# Patient Record
Sex: Female | Born: 1940 | Race: White | Hispanic: No | Smoking: Former smoker
Health system: Southern US, Community
[De-identification: ages and names within clinical notes are randomized; demographics above are authoritative.]

## PROBLEM LIST (undated history)

## (undated) DIAGNOSIS — C801 Malignant (primary) neoplasm, unspecified: Secondary | ICD-10-CM

## (undated) DIAGNOSIS — I10 Essential (primary) hypertension: Secondary | ICD-10-CM

## (undated) DIAGNOSIS — C50919 Malignant neoplasm of unspecified site of unspecified female breast: Secondary | ICD-10-CM

## (undated) DIAGNOSIS — M75122 Complete rotator cuff tear or rupture of left shoulder, not specified as traumatic: Secondary | ICD-10-CM

## (undated) DIAGNOSIS — S62102A Fracture of unspecified carpal bone, left wrist, initial encounter for closed fracture: Secondary | ICD-10-CM

## (undated) DIAGNOSIS — S62101A Fracture of unspecified carpal bone, right wrist, initial encounter for closed fracture: Secondary | ICD-10-CM

## (undated) DIAGNOSIS — Z89619 Acquired absence of unspecified leg above knee: Secondary | ICD-10-CM

## (undated) HISTORY — DX: Fracture of unspecified carpal bone, right wrist, initial encounter for closed fracture: S62.102A

## (undated) HISTORY — DX: Malignant (primary) neoplasm, unspecified: C80.1

## (undated) HISTORY — DX: Complete rotator cuff tear or rupture of left shoulder, not specified as traumatic: M75.122

## (undated) HISTORY — PX: ABDOMINAL HYSTERECTOMY: SHX81

## (undated) HISTORY — DX: Fracture of unspecified carpal bone, right wrist, initial encounter for closed fracture: S62.101A

## (undated) HISTORY — DX: Essential (primary) hypertension: I10

## (undated) HISTORY — PX: TONSILLECTOMY: SUR1361

## (undated) HISTORY — DX: Acquired absence of unspecified leg above knee: Z89.619

## (undated) HISTORY — DX: Malignant neoplasm of unspecified site of unspecified female breast: C50.919

---
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
## 2001-07-11 ENCOUNTER — Encounter (INDEPENDENT_AMBULATORY_CARE_PROVIDER_SITE_OTHER): Payer: Self-pay | Admitting: *Deleted

## 2003-04-17 ENCOUNTER — Encounter: Payer: Self-pay | Admitting: Sports Medicine

## 2003-04-17 ENCOUNTER — Encounter: Admission: RE | Admit: 2003-04-17 | Discharge: 2003-04-17 | Payer: Self-pay | Admitting: Family Medicine

## 2003-04-17 ENCOUNTER — Encounter: Admission: RE | Admit: 2003-04-17 | Discharge: 2003-04-17 | Payer: Self-pay | Admitting: Sports Medicine

## 2003-06-09 ENCOUNTER — Encounter: Admission: RE | Admit: 2003-06-09 | Discharge: 2003-06-09 | Payer: Self-pay | Admitting: Family Medicine

## 2003-10-09 ENCOUNTER — Encounter: Admission: RE | Admit: 2003-10-09 | Discharge: 2003-10-09 | Payer: Self-pay | Admitting: Sports Medicine

## 2003-10-13 ENCOUNTER — Encounter: Admission: RE | Admit: 2003-10-13 | Discharge: 2003-10-13 | Payer: Self-pay | Admitting: Family Medicine

## 2003-10-27 ENCOUNTER — Encounter: Admission: RE | Admit: 2003-10-27 | Discharge: 2003-10-27 | Payer: Self-pay | Admitting: Family Medicine

## 2003-11-11 ENCOUNTER — Ambulatory Visit (HOSPITAL_COMMUNITY): Admission: RE | Admit: 2003-11-11 | Discharge: 2003-11-11 | Payer: Self-pay | Admitting: Sports Medicine

## 2003-12-24 ENCOUNTER — Ambulatory Visit (HOSPITAL_COMMUNITY): Admission: RE | Admit: 2003-12-24 | Discharge: 2003-12-24 | Payer: Self-pay | Admitting: Sports Medicine

## 2003-12-26 ENCOUNTER — Encounter: Admission: RE | Admit: 2003-12-26 | Discharge: 2003-12-26 | Payer: Self-pay | Admitting: Family Medicine

## 2004-01-13 ENCOUNTER — Encounter: Admission: RE | Admit: 2004-01-13 | Discharge: 2004-01-13 | Payer: Self-pay | Admitting: Sports Medicine

## 2004-02-04 ENCOUNTER — Encounter: Admission: RE | Admit: 2004-02-04 | Discharge: 2004-02-04 | Payer: Self-pay | Admitting: Family Medicine

## 2004-02-06 ENCOUNTER — Encounter: Admission: RE | Admit: 2004-02-06 | Discharge: 2004-02-06 | Payer: Self-pay | Admitting: Family Medicine

## 2004-05-31 ENCOUNTER — Ambulatory Visit: Payer: Self-pay | Admitting: Sports Medicine

## 2004-06-09 ENCOUNTER — Ambulatory Visit: Payer: Self-pay | Admitting: Family Medicine

## 2004-08-25 ENCOUNTER — Ambulatory Visit: Payer: Self-pay | Admitting: Family Medicine

## 2004-08-26 ENCOUNTER — Ambulatory Visit: Payer: Self-pay | Admitting: Sports Medicine

## 2004-08-27 ENCOUNTER — Ambulatory Visit: Payer: Self-pay | Admitting: Family Medicine

## 2004-09-10 ENCOUNTER — Ambulatory Visit: Payer: Self-pay | Admitting: Family Medicine

## 2004-10-11 ENCOUNTER — Ambulatory Visit: Payer: Self-pay | Admitting: Family Medicine

## 2004-10-29 ENCOUNTER — Ambulatory Visit: Payer: Self-pay | Admitting: Sports Medicine

## 2004-11-24 ENCOUNTER — Ambulatory Visit: Payer: Self-pay | Admitting: Family Medicine

## 2004-12-28 ENCOUNTER — Ambulatory Visit: Payer: Self-pay | Admitting: Family Medicine

## 2005-03-09 ENCOUNTER — Ambulatory Visit: Payer: Self-pay | Admitting: Family Medicine

## 2005-03-25 ENCOUNTER — Ambulatory Visit: Payer: Self-pay | Admitting: Family Medicine

## 2005-03-30 ENCOUNTER — Encounter: Admission: RE | Admit: 2005-03-30 | Discharge: 2005-03-30 | Payer: Self-pay | Admitting: *Deleted

## 2005-04-12 ENCOUNTER — Encounter: Admission: RE | Admit: 2005-04-12 | Discharge: 2005-04-12 | Payer: Self-pay | Admitting: *Deleted

## 2005-11-01 ENCOUNTER — Other Ambulatory Visit: Admission: RE | Admit: 2005-11-01 | Discharge: 2005-11-01 | Payer: Self-pay | Admitting: Family Medicine

## 2005-11-01 ENCOUNTER — Ambulatory Visit: Payer: Self-pay | Admitting: Family Medicine

## 2005-11-01 ENCOUNTER — Encounter: Payer: Self-pay | Admitting: Family Medicine

## 2005-11-11 ENCOUNTER — Encounter: Admission: RE | Admit: 2005-11-11 | Discharge: 2005-11-11 | Payer: Self-pay | Admitting: Family Medicine

## 2005-11-14 ENCOUNTER — Ambulatory Visit: Payer: Self-pay | Admitting: Family Medicine

## 2005-12-21 ENCOUNTER — Ambulatory Visit: Payer: Self-pay | Admitting: Family Medicine

## 2005-12-30 ENCOUNTER — Encounter: Admission: RE | Admit: 2005-12-30 | Discharge: 2005-12-30 | Payer: Self-pay | Admitting: Family Medicine

## 2006-04-13 ENCOUNTER — Encounter: Admission: RE | Admit: 2006-04-13 | Discharge: 2006-04-13 | Payer: Self-pay | Admitting: Family Medicine

## 2006-08-01 ENCOUNTER — Ambulatory Visit: Payer: Self-pay | Admitting: Family Medicine

## 2006-09-07 DIAGNOSIS — N952 Postmenopausal atrophic vaginitis: Secondary | ICD-10-CM

## 2006-09-07 DIAGNOSIS — B0229 Other postherpetic nervous system involvement: Secondary | ICD-10-CM

## 2006-09-07 DIAGNOSIS — I10 Essential (primary) hypertension: Secondary | ICD-10-CM | POA: Insufficient documentation

## 2006-09-07 DIAGNOSIS — H9190 Unspecified hearing loss, unspecified ear: Secondary | ICD-10-CM | POA: Insufficient documentation

## 2006-09-08 ENCOUNTER — Encounter (INDEPENDENT_AMBULATORY_CARE_PROVIDER_SITE_OTHER): Payer: Self-pay | Admitting: *Deleted

## 2007-03-21 ENCOUNTER — Ambulatory Visit: Payer: Self-pay | Admitting: Family Medicine

## 2007-03-21 DIAGNOSIS — S78119A Complete traumatic amputation at level between unspecified hip and knee, initial encounter: Secondary | ICD-10-CM | POA: Insufficient documentation

## 2007-03-21 DIAGNOSIS — R030 Elevated blood-pressure reading, without diagnosis of hypertension: Secondary | ICD-10-CM | POA: Insufficient documentation

## 2007-04-16 ENCOUNTER — Encounter: Payer: Self-pay | Admitting: Family Medicine

## 2007-04-16 ENCOUNTER — Encounter: Admission: RE | Admit: 2007-04-16 | Discharge: 2007-04-16 | Payer: Self-pay | Admitting: Family Medicine

## 2007-04-18 ENCOUNTER — Encounter: Admission: RE | Admit: 2007-04-18 | Discharge: 2007-04-18 | Payer: Self-pay | Admitting: Family Medicine

## 2007-04-19 ENCOUNTER — Encounter: Payer: Self-pay | Admitting: Family Medicine

## 2007-05-30 ENCOUNTER — Emergency Department (HOSPITAL_COMMUNITY): Admission: EM | Admit: 2007-05-30 | Discharge: 2007-05-30 | Payer: Self-pay | Admitting: Family Medicine

## 2007-05-31 ENCOUNTER — Telehealth: Payer: Self-pay | Admitting: Family Medicine

## 2007-06-12 ENCOUNTER — Emergency Department (HOSPITAL_COMMUNITY): Admission: EM | Admit: 2007-06-12 | Discharge: 2007-06-12 | Payer: Self-pay | Admitting: Emergency Medicine

## 2007-06-12 ENCOUNTER — Encounter: Payer: Self-pay | Admitting: Family Medicine

## 2007-06-18 ENCOUNTER — Encounter: Payer: Self-pay | Admitting: Internal Medicine

## 2007-06-26 ENCOUNTER — Ambulatory Visit: Payer: Self-pay | Admitting: Internal Medicine

## 2007-07-08 LAB — CONVERTED CEMR LAB
Basophils Absolute: 0 10*3/uL (ref 0.0–0.1)
HCT: 38.4 % (ref 36.0–46.0)
Hemoglobin: 13.2 g/dL (ref 12.0–15.0)
Lymphocytes Relative: 26.1 % (ref 12.0–46.0)
MCHC: 34.4 g/dL (ref 30.0–36.0)
MCV: 92.8 fL (ref 78.0–100.0)
Monocytes Absolute: 0.4 10*3/uL (ref 0.2–0.7)
Monocytes Relative: 7.1 % (ref 3.0–11.0)
Neutro Abs: 4 10*3/uL (ref 1.4–7.7)
Neutrophils Relative %: 63.9 % (ref 43.0–77.0)
RDW: 12.1 % (ref 11.5–14.6)

## 2007-07-09 ENCOUNTER — Encounter (INDEPENDENT_AMBULATORY_CARE_PROVIDER_SITE_OTHER): Payer: Self-pay | Admitting: *Deleted

## 2007-10-15 ENCOUNTER — Ambulatory Visit: Payer: Self-pay | Admitting: Family Medicine

## 2007-10-15 DIAGNOSIS — R5383 Other fatigue: Secondary | ICD-10-CM

## 2007-10-15 DIAGNOSIS — R0609 Other forms of dyspnea: Secondary | ICD-10-CM

## 2007-10-15 DIAGNOSIS — R5381 Other malaise: Secondary | ICD-10-CM

## 2007-10-15 DIAGNOSIS — R0989 Other specified symptoms and signs involving the circulatory and respiratory systems: Secondary | ICD-10-CM

## 2007-10-16 ENCOUNTER — Telehealth (INDEPENDENT_AMBULATORY_CARE_PROVIDER_SITE_OTHER): Payer: Self-pay | Admitting: *Deleted

## 2007-10-16 ENCOUNTER — Encounter: Payer: Self-pay | Admitting: Family Medicine

## 2007-10-16 ENCOUNTER — Encounter (INDEPENDENT_AMBULATORY_CARE_PROVIDER_SITE_OTHER): Payer: Self-pay | Admitting: *Deleted

## 2007-10-19 LAB — CONVERTED CEMR LAB
Calcium: 9.5 mg/dL (ref 8.4–10.5)
Chloride: 101 meq/L (ref 96–112)
Creatinine, Ser: 0.8 mg/dL (ref 0.4–1.2)
Eosinophils Absolute: 0.1 10*3/uL (ref 0.0–0.7)
Eosinophils Relative: 2.2 % (ref 0.0–5.0)
Folate: 14 ng/mL
Free T4: 0.9 ng/dL (ref 0.6–1.6)
GFR calc non Af Amer: 76 mL/min
HCT: 39.2 % (ref 36.0–46.0)
Monocytes Absolute: 0.4 10*3/uL (ref 0.1–1.0)
Monocytes Relative: 12.7 % — ABNORMAL HIGH (ref 3.0–12.0)
Neutrophils Relative %: 10.7 % — ABNORMAL LOW (ref 43.0–77.0)
Platelets: 238 10*3/uL (ref 150–400)
RDW: 12.2 % (ref 11.5–14.6)
Sodium: 138 meq/L (ref 135–145)
T3, Free: 2.7 pg/mL (ref 2.3–4.2)
Vitamin B-12: 527 pg/mL (ref 211–911)
WBC: 3.2 10*3/uL — ABNORMAL LOW (ref 4.5–10.5)

## 2007-10-22 ENCOUNTER — Telehealth: Payer: Self-pay | Admitting: Family Medicine

## 2007-10-22 ENCOUNTER — Encounter: Payer: Self-pay | Admitting: Family Medicine

## 2007-10-23 ENCOUNTER — Telehealth (INDEPENDENT_AMBULATORY_CARE_PROVIDER_SITE_OTHER): Payer: Self-pay | Admitting: *Deleted

## 2007-10-24 ENCOUNTER — Ambulatory Visit: Payer: Self-pay

## 2007-10-24 ENCOUNTER — Ambulatory Visit: Payer: Self-pay | Admitting: Family Medicine

## 2007-10-24 ENCOUNTER — Encounter: Payer: Self-pay | Admitting: Family Medicine

## 2007-10-24 DIAGNOSIS — D7289 Other specified disorders of white blood cells: Secondary | ICD-10-CM

## 2007-10-24 LAB — CONVERTED CEMR LAB
Basophils Absolute: 0 10*3/uL (ref 0.0–0.1)
Eosinophils Absolute: 0.1 10*3/uL (ref 0.0–0.7)
HCT: 37.3 % (ref 36.0–46.0)
MCV: 94.1 fL (ref 78.0–100.0)
Monocytes Absolute: 0.5 10*3/uL (ref 0.1–1.0)
Neutrophils Relative %: 60.5 % (ref 43.0–77.0)
Platelets: 233 10*3/uL (ref 150–400)
RDW: 12 % (ref 11.5–14.6)

## 2007-10-25 ENCOUNTER — Encounter (INDEPENDENT_AMBULATORY_CARE_PROVIDER_SITE_OTHER): Payer: Self-pay | Admitting: *Deleted

## 2007-10-25 DIAGNOSIS — R9389 Abnormal findings on diagnostic imaging of other specified body structures: Secondary | ICD-10-CM | POA: Insufficient documentation

## 2007-10-26 ENCOUNTER — Ambulatory Visit: Payer: Self-pay | Admitting: Internal Medicine

## 2007-10-26 ENCOUNTER — Encounter (INDEPENDENT_AMBULATORY_CARE_PROVIDER_SITE_OTHER): Payer: Self-pay | Admitting: *Deleted

## 2007-10-26 DIAGNOSIS — J019 Acute sinusitis, unspecified: Secondary | ICD-10-CM

## 2007-10-30 ENCOUNTER — Telehealth (INDEPENDENT_AMBULATORY_CARE_PROVIDER_SITE_OTHER): Payer: Self-pay | Admitting: *Deleted

## 2007-11-12 ENCOUNTER — Ambulatory Visit: Payer: Self-pay | Admitting: Cardiovascular Disease

## 2007-11-12 LAB — CONVERTED CEMR LAB
Free T4: 1 ng/dL (ref 0.6–1.6)
TSH: 1.77 microintl units/mL (ref 0.35–5.50)

## 2008-01-29 ENCOUNTER — Emergency Department (HOSPITAL_COMMUNITY): Admission: EM | Admit: 2008-01-29 | Discharge: 2008-01-29 | Payer: Self-pay | Admitting: Emergency Medicine

## 2008-03-24 ENCOUNTER — Telehealth (INDEPENDENT_AMBULATORY_CARE_PROVIDER_SITE_OTHER): Payer: Self-pay | Admitting: *Deleted

## 2008-03-25 ENCOUNTER — Encounter: Payer: Self-pay | Admitting: Family Medicine

## 2008-03-25 ENCOUNTER — Ambulatory Visit: Payer: Self-pay | Admitting: Family Medicine

## 2008-03-25 ENCOUNTER — Encounter (INDEPENDENT_AMBULATORY_CARE_PROVIDER_SITE_OTHER): Payer: Self-pay | Admitting: *Deleted

## 2008-03-25 DIAGNOSIS — N959 Unspecified menopausal and perimenopausal disorder: Secondary | ICD-10-CM | POA: Insufficient documentation

## 2008-03-26 LAB — CONVERTED CEMR LAB
AST: 26 units/L (ref 0–37)
Bilirubin, Direct: 0.1 mg/dL (ref 0.0–0.3)
Chloride: 107 meq/L (ref 96–112)
Creatinine, Ser: 0.7 mg/dL (ref 0.4–1.2)
Folate: 16.1 ng/mL
GFR calc non Af Amer: 89 mL/min
Lymphocytes Relative: 31.8 % (ref 12.0–46.0)
Monocytes Absolute: 0.4 10*3/uL (ref 0.1–1.0)
Monocytes Relative: 8.6 % (ref 3.0–12.0)
Neutrophils Relative %: 57.6 % (ref 43.0–77.0)
Platelets: 293 10*3/uL (ref 150–400)
RDW: 13.9 % (ref 11.5–14.6)
Sodium: 141 meq/L (ref 135–145)
TSH: 1.79 microintl units/mL (ref 0.35–5.50)
Total Bilirubin: 1 mg/dL (ref 0.3–1.2)
Vit D, 1,25-Dihydroxy: 33 (ref 30–89)
Vitamin B-12: 684 pg/mL (ref 211–911)
WBC: 5 10*3/uL (ref 4.5–10.5)

## 2008-03-27 ENCOUNTER — Encounter (INDEPENDENT_AMBULATORY_CARE_PROVIDER_SITE_OTHER): Payer: Self-pay | Admitting: *Deleted

## 2008-04-16 ENCOUNTER — Encounter: Payer: Self-pay | Admitting: Family Medicine

## 2008-04-16 ENCOUNTER — Encounter: Admission: RE | Admit: 2008-04-16 | Discharge: 2008-04-16 | Payer: Self-pay | Admitting: Family Medicine

## 2008-04-23 ENCOUNTER — Ambulatory Visit: Payer: Self-pay | Admitting: Gastroenterology

## 2008-04-23 ENCOUNTER — Telehealth (INDEPENDENT_AMBULATORY_CARE_PROVIDER_SITE_OTHER): Payer: Self-pay | Admitting: *Deleted

## 2008-04-23 DIAGNOSIS — M810 Age-related osteoporosis without current pathological fracture: Secondary | ICD-10-CM | POA: Insufficient documentation

## 2008-04-24 ENCOUNTER — Encounter (INDEPENDENT_AMBULATORY_CARE_PROVIDER_SITE_OTHER): Payer: Self-pay | Admitting: *Deleted

## 2008-04-28 ENCOUNTER — Ambulatory Visit: Payer: Self-pay | Admitting: Gastroenterology

## 2008-04-29 ENCOUNTER — Telehealth: Payer: Self-pay | Admitting: Gastroenterology

## 2008-08-14 ENCOUNTER — Ambulatory Visit: Payer: Self-pay | Admitting: Family Medicine

## 2008-08-18 ENCOUNTER — Ambulatory Visit: Payer: Self-pay | Admitting: Family Medicine

## 2008-08-18 DIAGNOSIS — L299 Pruritus, unspecified: Secondary | ICD-10-CM | POA: Insufficient documentation

## 2008-08-18 LAB — CONVERTED CEMR LAB
BUN: 16 mg/dL (ref 6–23)
Chloride: 110 meq/L (ref 96–112)
GFR calc non Af Amer: 66 mL/min
Potassium: 3.3 meq/L — ABNORMAL LOW (ref 3.5–5.1)

## 2008-08-27 ENCOUNTER — Telehealth: Payer: Self-pay | Admitting: Family Medicine

## 2008-08-27 LAB — CONVERTED CEMR LAB: Vit D, 1,25-Dihydroxy: 44 (ref 30–89)

## 2008-08-28 ENCOUNTER — Encounter (INDEPENDENT_AMBULATORY_CARE_PROVIDER_SITE_OTHER): Payer: Self-pay | Admitting: *Deleted

## 2008-09-26 ENCOUNTER — Telehealth: Payer: Self-pay | Admitting: Family Medicine

## 2008-10-05 IMAGING — CR DG FINGER MIDDLE 2+V*L*
1 series · 1 of 1 positions shown · non-contrast
Comparison: None

CLINICAL DATA: Finger pain and swelling.

LEFT MIDDLE FINGER 2+V

[view not recorded]
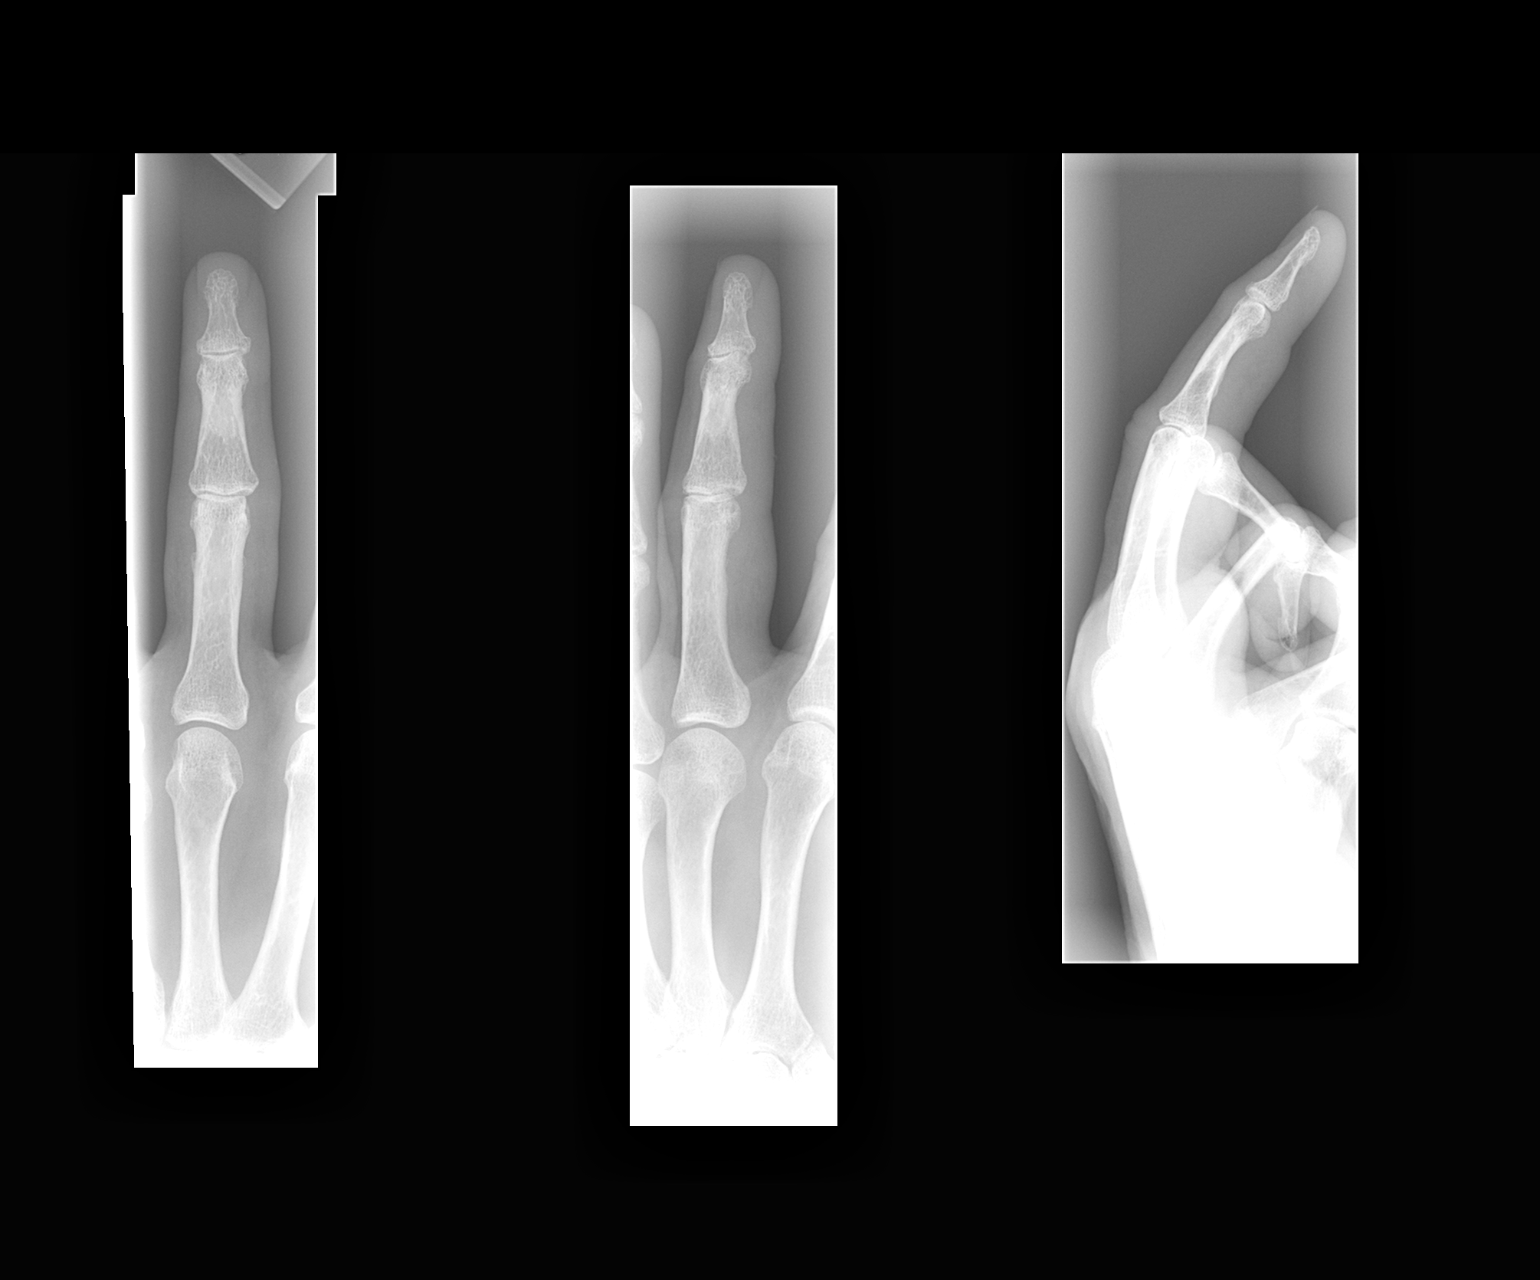

[1 of 1 positions shown; findings below may reference images not displayed]

FINDINGS: Negative for fracture or dislocation.  There is no
foreign body.  No arthropathy is identified.
IMPRESSION: Negative

## 2008-12-26 ENCOUNTER — Encounter: Payer: Self-pay | Admitting: Family Medicine

## 2008-12-31 ENCOUNTER — Telehealth (INDEPENDENT_AMBULATORY_CARE_PROVIDER_SITE_OTHER): Payer: Self-pay | Admitting: *Deleted

## 2008-12-31 ENCOUNTER — Encounter: Payer: Self-pay | Admitting: Family Medicine

## 2009-01-01 ENCOUNTER — Encounter: Admission: RE | Admit: 2009-01-01 | Discharge: 2009-03-09 | Payer: Self-pay | Admitting: Family Medicine

## 2009-01-06 ENCOUNTER — Telehealth (INDEPENDENT_AMBULATORY_CARE_PROVIDER_SITE_OTHER): Payer: Self-pay | Admitting: *Deleted

## 2009-01-07 ENCOUNTER — Ambulatory Visit (HOSPITAL_BASED_OUTPATIENT_CLINIC_OR_DEPARTMENT_OTHER): Admission: RE | Admit: 2009-01-07 | Discharge: 2009-01-08 | Payer: Self-pay | Admitting: Orthopedic Surgery

## 2009-01-15 ENCOUNTER — Encounter: Payer: Self-pay | Admitting: Family Medicine

## 2009-01-20 ENCOUNTER — Encounter: Payer: Self-pay | Admitting: Family Medicine

## 2009-01-21 ENCOUNTER — Encounter: Payer: Self-pay | Admitting: Family Medicine

## 2009-02-20 ENCOUNTER — Encounter: Payer: Self-pay | Admitting: Family Medicine

## 2009-03-05 ENCOUNTER — Encounter: Payer: Self-pay | Admitting: Family Medicine

## 2009-03-20 ENCOUNTER — Encounter: Payer: Self-pay | Admitting: Family Medicine

## 2009-03-30 ENCOUNTER — Encounter: Payer: Self-pay | Admitting: Family Medicine

## 2009-04-17 ENCOUNTER — Encounter: Admission: RE | Admit: 2009-04-17 | Discharge: 2009-04-17 | Payer: Self-pay | Admitting: Internal Medicine

## 2009-04-20 ENCOUNTER — Encounter: Payer: Self-pay | Admitting: Family Medicine

## 2009-05-22 ENCOUNTER — Encounter: Payer: Self-pay | Admitting: Family Medicine

## 2009-06-19 ENCOUNTER — Encounter: Payer: Self-pay | Admitting: Family Medicine

## 2009-07-20 ENCOUNTER — Encounter: Payer: Self-pay | Admitting: Family Medicine

## 2009-07-28 ENCOUNTER — Ambulatory Visit (HOSPITAL_BASED_OUTPATIENT_CLINIC_OR_DEPARTMENT_OTHER): Admission: RE | Admit: 2009-07-28 | Discharge: 2009-07-28 | Payer: Self-pay | Admitting: Orthopedic Surgery

## 2009-08-04 ENCOUNTER — Encounter: Payer: Self-pay | Admitting: Family Medicine

## 2009-08-11 ENCOUNTER — Encounter: Payer: Self-pay | Admitting: Family Medicine

## 2010-02-18 ENCOUNTER — Encounter: Admission: RE | Admit: 2010-02-18 | Discharge: 2010-04-27 | Payer: Self-pay | Admitting: Family Medicine

## 2010-04-21 ENCOUNTER — Encounter: Admission: RE | Admit: 2010-04-21 | Discharge: 2010-04-21 | Payer: Self-pay | Admitting: Family Medicine

## 2010-05-04 ENCOUNTER — Encounter
Admission: RE | Admit: 2010-05-04 | Discharge: 2010-06-30 | Payer: Self-pay | Source: Home / Self Care | Attending: Family Medicine | Admitting: Family Medicine

## 2010-07-31 ENCOUNTER — Encounter: Payer: Self-pay | Admitting: *Deleted

## 2010-08-10 NOTE — Letter (Signed)
Summary: [facility] [place]   Imported By: Lanelle Bal 08/19/2009 09:40:19  _____________________________________________________________________  External Attachment:    Type:   Image     Comment:   External Document

## 2010-08-10 NOTE — Letter (Signed)
Summary: [facility] [place]   Imported By: Lanelle Bal 08/12/2009 12:00:33  _____________________________________________________________________  External Attachment:    Type:   Image     Comment:   External Document

## 2010-08-10 NOTE — Letter (Signed)
Summary: [facility] [place]   Imported By: Lanelle Bal 07/28/2009 13:49:55  _____________________________________________________________________  External Attachment:    Type:   Image     Comment:   External Document

## 2010-10-18 LAB — BASIC METABOLIC PANEL
BUN: 15 mg/dL (ref 6–23)
CO2: 28 mEq/L (ref 19–32)
Chloride: 108 mEq/L (ref 96–112)
Creatinine, Ser: 0.72 mg/dL (ref 0.4–1.2)
Glucose, Bld: 105 mg/dL — ABNORMAL HIGH (ref 70–99)
Potassium: 3.5 mEq/L (ref 3.5–5.1)

## 2010-10-18 LAB — POCT HEMOGLOBIN-HEMACUE: Hemoglobin: 14.8 g/dL (ref 12.0–15.0)

## 2010-11-23 NOTE — Op Note (Signed)
Melinda Martinez, Melinda Martinez               ACCOUNT NO.:  [id number]   MEDICAL RECORD NO.:  [id number]          PATIENT TYPE:  AMB   LOCATION:  DSC                          FACILITY:  [facility]   PHYSICIAN:  Cindee Salt, M.D.       DATE OF BIRTH:  May 17, 1941   DATE OF PROCEDURE:  01/07/2009  DATE OF DISCHARGE:                               OPERATIVE REPORT   PREOPERATIVE DIAGNOSIS:  Malunion, right distal radius, carpal tunnel  syndrome.   POSTOPERATIVE DIAGNOSIS:  Malunion, right distal radius, carpal tunnel  syndrome.   OPERATION:  Osteotomy, right distal radius with allograft, Acumed plate  fixation, and carpal tunnel release, right hand.   SURGEON:  Cindee Salt, MD   ASSISTANT:  Artist Pais. Mina Marble, MD   ANESTHESIA:  Axillary block.   ANESTHESIOLOGIST:  Zenon Mayo, MD   HISTORY:  The patient is a 70 year old female who suffered a fall and  fracture of her right distal radius.  This was treated conservatively  and has gone onto a volar flexed malunion.  She is limited in pronation  and supination with pain and with shortening of the ulnar positive, she  has ulnar-sided wrist pain and is desirous to have osteotomy with carpal  tunnel release.  Pre, peri, and postoperative course has been discussed  along with risks and complications.  She is aware that there is no  guarantee with the surgery, possibility of infection, recurrence of  injury to arteries, nerves, tendons, incomplete relief of symptoms, and  dystrophy.  In the preoperative area, the patient is seen, the extremity  marked by both the patient and surgeon, and antibiotic given.   PROCEDURE IN DETAIL:  The patient was brought to the operating room  where an axillary block was carried out without difficulty under the  direction of Dr. Sampson Goon.  She was prepped using ChloraPrep, supine  position with the right arm free.  The time-out was taken and 3-minute  dry time was allowed before draping.  The limb was  exsanguinated with an  Esmarch bandage.  Tourniquet placed high on the arm was inflated to 250  mmHg.  A longitudinal incision was made in the palm and carried down  through the subcutaneous tissue.  Bleeders were electrocauterized.  The  ulnar artery and nerve were found immediately.  The flexor tendon of the  ring little finger identified to the ulnar side of the median nerve.  The carpal retinaculum was incised with sharp dissection.  A right-angle  and Sewall retractor were placed between skin and forearm fascia.  Fascia was released for approximately 1.5 cm proximal to the wrist  crease under direct vision.  Canal was explored and no further lesions  were identified.  The wound was irrigated.  The skin closed with  interrupted 4-0 Vicryl Rapide sutures.  A separate incision was then  made over the radial aspect of the wrist, carried down through  subcutaneous tissue.  Bleeders were again electrocauterized.  The  interval between the flexor carpi radialis and radial artery was opened.  This was followed down to the pronator quadratus.  This was found to be  deep in the wound.  This was incised on its radial border.  The fracture  site was immediately apparent.  Using small osteotomes, the fracture  site was opened.  This allowed full visualization.  The fracture was  then opened.  Retractors were placed to allow visualization.  The iliac  crest allograft was soaked.  This was then shaped using oscillating saw  to allow visualization of the fracture site.  This was then checked for  positioning with OEC image intensification.  The bone graft was placed.  A narrow Acumed plate was then selected for the right wrist.  This was  placed and affixed proximally in the gliding hole.  This was drilled  with 2.8 drill.  A 14-mm screw was placed.  This was then adjusted  distally.  The guide placed distally.  A 0.054 K-wire was then inserted  for placement of the distal screws.  This was found to  be in good  position.  These were each drilled separately and placed.  These  measured between 16 and 22 mm.  A variety of smooth locking pegs and  screws nonlocking were inserted to hold the bone graft down the distal  portion back down onto the plate inset slightly probably distally.  X-  rays confirmed positioning.  No screw or peg appeared to be within the  joint.  Full pronation and supination was afforded.  The remaining  proximal screws were then placed.  These measured 16 and 12 mm.  Full  pronation and supination was allowed.  The wound was copiously irrigated  with saline.  The area around the graft was then packed with cancellous  chips.  The pronator closed as much as possible with figure-of-eight 4-0  Vicryl sutures, the subcutaneous tissue with interrupted 4-0 Vicryl, and  the skin with interrupted 4-0 Vicryl Rapide sutures.  A sterile  compressive dressing dorsal palmar splint was applied.  On deflation of  the tourniquet, all fingers immediately pinked.  She was taken to the  recovery room for observation in satisfactory condition.  She will be  discharged to home to return to the [facility] in 1 week  on [place].           ______________________________  Cindee Salt, M.D.     GK/MEDQ  D:  01/07/2009  T:  01/08/2009  Job:  [id number]

## 2010-11-23 NOTE — Assessment & Plan Note (Signed)
[facility]                            CARDIOLOGY OFFICE NOTE   Melinda Martinez                   MRN:          [id number]  DATE:11/12/2007                            DOB:          1941/04/06    Melinda Martinez is a delightful 70 year old patient referred for question  hypertension, abnormal EKG abnormal echocardiogram.  In talking to the  patient, she has been doing fairly well.  She is not having significant  chest pain.  She gets occasional exertional dyspnea.  This is not new.  Some of it is related to the fact that she has had a left above-knee  amputation.   She has also been bothered by allergies since moving to [place].   There has been a question of borderline hypertension in the past.  She  is currently not on medication.   The patient ambulates with a lower extremity prosthesis.  She recently  has had it redone, and it needs to be revised, as it is hurting her  stump in her leg.   In reviewing the patient's EKGs, it shows poor R-wave progression and  question of an old anterior MI.  Her echocardiogram abnormality is  somewhat difficult to decipher.  I read her echocardiogram report.  It  apparently is the abnormal pseudonormalization and diastolic relaxation  abnormality.   I  Explained to the patient at that is not a significant problem.  She  has good LV function and no significant valvular heart disease.   The patient's septal thickness is only 10 mm, and left atrial dimension  is upper normal at 40 mm.   Some of these pseudonormalization patterns have to do with age.   REVIEW OF SYSTEMS:  Otherwise remarkable for no significant palpitations  or syncope.  No TIA or CVA.   She seems to be extremely concerned about her thyroid status.  In  reviewing the patient's labs, she just had a normal free T4, free T3,  and TSH, but she wants to be tested again   She also appears to be somewhat anxious.  She seems to have a  hyperdynamic personality.   PAST MEDICAL HISTORY:  1. Otherwise remarkable for borderline hypertension.  2. 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  3. Hysterectomy.   FAMILY HISTORY:  Remarkable for mother and father dying of cancer.   ALLERGIES:  1. PENICILLIN/  2. TYLENOL.  3. TALWIN.  4. CODEINE.  5. DEMEROL.   She quit smoking in 1978.   The patient is single.  She has been widowed for a long time.  She has  no children.  She is originally from [place], and spent some time in  [place] where she has an abundance of family, and has been here in  [place] for about 5 years.  She is a Hydrologist.   Her activity is limited by her artificial left lower leg.   CURRENT MEDICATIONS:  Include vitamin D, potassium, Mega Minerals,  Lyrica, ginkgo biloba, calcium, and magnesium.   PHYSICAL EXAMINATION:  GENERAL:  Remarkable for a talkative an animated  middle-aged  white female in no distress.  VITAL SIGNS:  Her blood pressure is 160/95, pulse 86 and regular, weight  148, respiratory rate 16.  HEENT:  Unremarkable.  NECK:  Carotids normal, without bruit, no lymphadenopathy, thyromegaly,  or JVP elevation.  LUNGS:  Clear.  Good diaphragmatic motion.  No wheezing.  HEART:  S1, S2, with normal heart sounds.  PMI normal.  ABDOMEN:  Benign.  Status post hysterectomy.  No AAA, no bruit, no  hepatosplenomegaly, no hepatojugular reflux, no tenderness.  EXTREMITIES:  Distal pulses intact on the right.  No edema.  Status post  left above-knee amputation, with prosthetic leg on.  NEUROLOGIC:  Nonfocal.  SKIN:  Warm and dry.  No muscular weakness.   EKG shows sinus rhythm, with poor R-wave progression.  I reviewed all of  Dr. Ernst Spell notes and echocardiogram which was performed October 25, 2007.   I also reviewed pertinent lab work on the patient's which was performed  on October 25, 2007 as well.   Hematocrit was 39.2, platelet count was 238.   Potassium was 3.2, BUN was  17, creatinine was 0.8.   IMPRESSION:  1. Abnormal ECG, with poor R wave progression.  No wall motion      abnormality by echocardiogram.  Suggest that this involve some      rotation of the heart and possible LVH and/or lead placement, not      significant.  2. Abnormal echocardiogram, with pseudonormalization pattern, probably      age related, and may be related to high blood pressure, not      clinically significant at this time.  3. Question of hypertension.  The patient probably should be started      on lisinopril 10 mg a day or another type ACE inhibitor.  She was      encouraged to get a blood pressure cuff and monitor her blood      pressure at home.  I suspect that it tends to run high.  I will      leave this up to Dr. Laury Axon to see if she wants to start her on      blood pressure pills, given that she is on potassium replacement      and still is running somewhat hypokalemic, further workup for      hyperaldosteronism may also be in order.  4. Status post left above-knee amputation for sarcoma.  Follow up with      the prosthetic device company on [facility] for revision of her      artificial leg.  5. Question of vitamin D deficiency.  Continue replacements weekly.      Follow up all levels and 6 months.  6. Question phantom pain from amputation.  Continue Lyrica 500 mg a      day.  Follow up with neurology and/or Dr. Laury Axon.   Overall, I think the patient's heart is probably doing well, and she  probably needs some treatment for high blood pressure.     Noralyn Pick. Eden Emms, MD, [facility]  Electronically Signed    PCN/MedQ  DD: 11/12/2007  DT: 11/12/2007  Job #: [id number]   cc:   Lelon Perla, DO

## 2010-11-26 NOTE — Assessment & Plan Note (Signed)
[facility]                                 ON-CALL NOTE   TIFFNEY, HAUGHTON                   MRN:          [id number]  DATE:08/05/2006                            DOB:          02-12-1941    [phone].  Patient of Dr. Laury Axon.   The patient is calling because she has a cut.  It is on her hand.  She  has full range of motion, did it about 3 or 4 hours ago.  I advised  local care, see Dr. Laury Axon in the morning for eval.     Tinnie Gens A. Tawanna Cooler, MD  Electronically Signed    JAT/MedQ  DD: 08/08/2006  DT: 08/08/2006  Job #: [id number]

## 2010-11-26 NOTE — Letter (Signed)
May 13, 2008    Policy # [id number]  Plan J   RE:  Melinda Martinez, Melinda Martinez  MRN:  [id number]  /  DOB:  1941-02-22   To whom it may concern:   Melinda Martinez is a patient of mine at [facility].  She is  70 years old and has a significant family history for colon cancer.  Specifically, her mother and father have both had colon cancer and that  puts her at quite increased risk for getting colon cancer herself.  I  performed a colonoscopy on her April 28, 2008, and unfortunately, she  has a extremely tortuous sigmoid colon which precluded a complete  examination.  This means that I was only able to examine approximately  one quarter to one third of her colon properly.  I have recommended that  she have the rest of her colon evaluated by CT colonography which is  also known as virtual colonoscopy.  She has informed me that this is  not a covered test by her insurance companies and has asked me to write  a letter on her behalf.   In my medical option, I think it is extremely important that [name] undergo further evaluation of her colon.  CT colonography is  absolutely the best next test that is currently available.  Previously  the patient's would only be able to undergo a test called a barium enema  which has been proven to be highly insensitive to diagnosing colon  polyps and colon cancer.  I urge all of her insurance agencies to  reconsider their position and cover this CT colonography for her.  I  will be keeping a copy of this note in her medical chart for our own  records as well as to aid in any future legal issues should they arise  due to her not getting this absolutely required test.    Sincerely,      Rachael Fee, MD  Electronically Signed    DPJ/MedQ  DD: 05/13/2008  DT: 05/14/2008  Job #: [id number]   CC:   Lelon Perla, DO  [employer]

## 2011-01-10 ENCOUNTER — Other Ambulatory Visit: Payer: Self-pay | Admitting: Gynecology

## 2011-05-06 ENCOUNTER — Other Ambulatory Visit: Payer: Self-pay | Admitting: Gynecology

## 2011-05-12 ENCOUNTER — Other Ambulatory Visit: Payer: Self-pay | Admitting: Gynecology

## 2011-05-12 DIAGNOSIS — Z1231 Encounter for screening mammogram for malignant neoplasm of breast: Secondary | ICD-10-CM

## 2011-05-18 ENCOUNTER — Ambulatory Visit: Payer: Self-pay

## 2011-05-19 ENCOUNTER — Ambulatory Visit
Admission: RE | Admit: 2011-05-19 | Discharge: 2011-05-19 | Disposition: A | Discharge status: Home / Self Care | Payer: Medicare Other | Source: Ambulatory Visit | Attending: Gynecology | Admitting: Gynecology

## 2011-05-19 DIAGNOSIS — Z1231 Encounter for screening mammogram for malignant neoplasm of breast: Secondary | ICD-10-CM

## 2011-05-25 ENCOUNTER — Other Ambulatory Visit: Payer: Self-pay | Admitting: Gynecology

## 2011-05-25 DIAGNOSIS — N6452 Nipple discharge: Secondary | ICD-10-CM

## 2011-06-07 ENCOUNTER — Ambulatory Visit
Admission: RE | Admit: 2011-06-07 | Discharge: 2011-06-07 | Disposition: A | Discharge status: Home / Self Care | Payer: Medicare Other | Source: Ambulatory Visit | Attending: Gynecology | Admitting: Gynecology

## 2011-06-07 DIAGNOSIS — N6452 Nipple discharge: Secondary | ICD-10-CM

## 2011-07-25 ENCOUNTER — Other Ambulatory Visit (HOSPITAL_COMMUNITY): Payer: Self-pay | Admitting: *Deleted

## 2011-07-26 ENCOUNTER — Ambulatory Visit (HOSPITAL_COMMUNITY)
Admission: RE | Admit: 2011-07-26 | Discharge: 2011-07-26 | Disposition: A | Discharge status: Home / Self Care | Payer: Medicare Other | Source: Ambulatory Visit | Attending: Gynecology | Admitting: Gynecology

## 2011-07-26 DIAGNOSIS — M810 Age-related osteoporosis without current pathological fracture: Secondary | ICD-10-CM | POA: Insufficient documentation

## 2011-07-26 MED ORDER — ZOLEDRONIC ACID 5 MG/100ML IV SOLN
5.0000 mg | INTRAVENOUS | Status: DC
  Filled 2011-07-26: qty 100

## 2012-05-08 ENCOUNTER — Other Ambulatory Visit: Payer: Self-pay | Admitting: Gynecology

## 2012-05-08 DIAGNOSIS — Z1231 Encounter for screening mammogram for malignant neoplasm of breast: Secondary | ICD-10-CM

## 2012-06-18 ENCOUNTER — Ambulatory Visit: Payer: Medicare Other

## 2012-06-26 ENCOUNTER — Ambulatory Visit: Payer: Medicare Other

## 2012-07-19 ENCOUNTER — Ambulatory Visit: Payer: Medicare Other

## 2012-10-18 ENCOUNTER — Ambulatory Visit: Payer: Medicare Other

## 2013-05-14 ENCOUNTER — Other Ambulatory Visit: Payer: Self-pay | Admitting: Internal Medicine

## 2013-05-14 ENCOUNTER — Ambulatory Visit
Admission: RE | Admit: 2013-05-14 | Discharge: 2013-05-14 | Disposition: A | Discharge status: Home / Self Care | Payer: Medicare Other | Source: Ambulatory Visit | Attending: Internal Medicine | Admitting: Internal Medicine

## 2013-05-14 DIAGNOSIS — Z1231 Encounter for screening mammogram for malignant neoplasm of breast: Secondary | ICD-10-CM

## 2013-05-14 DIAGNOSIS — K562 Volvulus: Secondary | ICD-10-CM

## 2013-05-14 DIAGNOSIS — Z1211 Encounter for screening for malignant neoplasm of colon: Secondary | ICD-10-CM

## 2013-05-15 ENCOUNTER — Ambulatory Visit: Payer: Medicare Other

## 2013-05-20 ENCOUNTER — Other Ambulatory Visit: Payer: Self-pay | Admitting: Internal Medicine

## 2013-05-20 ENCOUNTER — Ambulatory Visit
Admission: RE | Admit: 2013-05-20 | Discharge: 2013-05-20 | Disposition: A | Discharge status: Home / Self Care | Payer: Medicare Other | Source: Ambulatory Visit | Attending: Internal Medicine | Admitting: Internal Medicine

## 2013-05-20 DIAGNOSIS — K562 Volvulus: Secondary | ICD-10-CM

## 2013-05-20 DIAGNOSIS — R928 Other abnormal and inconclusive findings on diagnostic imaging of breast: Secondary | ICD-10-CM

## 2013-05-22 ENCOUNTER — Other Ambulatory Visit: Payer: Medicare Other

## 2013-05-24 ENCOUNTER — Ambulatory Visit
Admission: RE | Admit: 2013-05-24 | Discharge: 2013-05-24 | Disposition: A | Discharge status: Home / Self Care | Payer: Medicare Other | Source: Ambulatory Visit | Attending: Internal Medicine | Admitting: Internal Medicine

## 2013-05-24 ENCOUNTER — Other Ambulatory Visit: Payer: Self-pay | Admitting: Internal Medicine

## 2013-05-24 DIAGNOSIS — R928 Other abnormal and inconclusive findings on diagnostic imaging of breast: Secondary | ICD-10-CM

## 2013-05-27 ENCOUNTER — Ambulatory Visit
Admission: RE | Admit: 2013-05-27 | Discharge: 2013-05-27 | Disposition: A | Discharge status: Home / Self Care | Payer: Medicare Other | Source: Ambulatory Visit | Attending: Internal Medicine | Admitting: Internal Medicine

## 2013-05-27 ENCOUNTER — Other Ambulatory Visit: Payer: Self-pay | Admitting: Internal Medicine

## 2013-05-27 DIAGNOSIS — R928 Other abnormal and inconclusive findings on diagnostic imaging of breast: Secondary | ICD-10-CM

## 2013-05-28 ENCOUNTER — Other Ambulatory Visit: Payer: Self-pay | Admitting: Internal Medicine

## 2013-05-28 DIAGNOSIS — D0510 Intraductal carcinoma in situ of unspecified breast: Secondary | ICD-10-CM

## 2013-05-29 ENCOUNTER — Telehealth: Payer: Self-pay | Admitting: *Deleted

## 2013-05-29 DIAGNOSIS — C50411 Malignant neoplasm of upper-outer quadrant of right female breast: Secondary | ICD-10-CM | POA: Insufficient documentation

## 2013-05-29 NOTE — Telephone Encounter (Signed)
Confirmed [facility] for 06/05/13 at 0800.  Instructions and contact information given.

## 2013-05-31 ENCOUNTER — Other Ambulatory Visit: Payer: Medicare Other

## 2013-06-03 ENCOUNTER — Ambulatory Visit
Admission: RE | Admit: 2013-06-03 | Discharge: 2013-06-03 | Disposition: A | Discharge status: Home / Self Care | Payer: Medicare Other | Source: Ambulatory Visit | Attending: Internal Medicine | Admitting: Internal Medicine

## 2013-06-03 DIAGNOSIS — D0510 Intraductal carcinoma in situ of unspecified breast: Secondary | ICD-10-CM

## 2013-06-03 MED ORDER — GADOBENATE DIMEGLUMINE 529 MG/ML IV SOLN
15.0000 mL | Freq: Once | INTRAVENOUS | Status: AC | PRN
  Administered 2013-06-03: 15 mL via INTRAVENOUS

## 2013-06-05 ENCOUNTER — Telehealth: Payer: Self-pay | Admitting: Oncology

## 2013-06-05 ENCOUNTER — Encounter: Payer: Self-pay | Admitting: *Deleted

## 2013-06-05 ENCOUNTER — Ambulatory Visit: Payer: Medicare Other

## 2013-06-05 ENCOUNTER — Other Ambulatory Visit (HOSPITAL_BASED_OUTPATIENT_CLINIC_OR_DEPARTMENT_OTHER): Payer: Medicare Other | Admitting: Lab

## 2013-06-05 ENCOUNTER — Ambulatory Visit (HOSPITAL_BASED_OUTPATIENT_CLINIC_OR_DEPARTMENT_OTHER): Payer: Medicare Other | Admitting: General Surgery

## 2013-06-05 ENCOUNTER — Ambulatory Visit
Admission: RE | Admit: 2013-06-05 | Discharge: 2013-06-05 | Disposition: A | Discharge status: Home / Self Care | Payer: Medicare Other | Source: Ambulatory Visit | Attending: Radiation Oncology | Admitting: Radiation Oncology

## 2013-06-05 ENCOUNTER — Encounter (INDEPENDENT_AMBULATORY_CARE_PROVIDER_SITE_OTHER): Payer: Self-pay | Admitting: General Surgery

## 2013-06-05 ENCOUNTER — Ambulatory Visit: Payer: Medicare Other | Attending: General Surgery | Admitting: Physical Therapy

## 2013-06-05 ENCOUNTER — Encounter: Payer: Self-pay | Admitting: Oncology

## 2013-06-05 ENCOUNTER — Ambulatory Visit (HOSPITAL_BASED_OUTPATIENT_CLINIC_OR_DEPARTMENT_OTHER): Payer: Medicare Other | Admitting: Oncology

## 2013-06-05 VITALS — BP 168/82 | HR 82 | Temp 98.0°F | Resp 18 | Ht 65.0 in | Wt 158.5 lb

## 2013-06-05 DIAGNOSIS — C50411 Malignant neoplasm of upper-outer quadrant of right female breast: Secondary | ICD-10-CM

## 2013-06-05 DIAGNOSIS — C50919 Malignant neoplasm of unspecified site of unspecified female breast: Secondary | ICD-10-CM

## 2013-06-05 DIAGNOSIS — Z170 Estrogen receptor positive status [ER+]: Secondary | ICD-10-CM

## 2013-06-05 DIAGNOSIS — C50419 Malignant neoplasm of upper-outer quadrant of unspecified female breast: Secondary | ICD-10-CM

## 2013-06-05 DIAGNOSIS — IMO0001 Reserved for inherently not codable concepts without codable children: Secondary | ICD-10-CM | POA: Insufficient documentation

## 2013-06-05 DIAGNOSIS — M899 Disorder of bone, unspecified: Secondary | ICD-10-CM

## 2013-06-05 DIAGNOSIS — M810 Age-related osteoporosis without current pathological fracture: Secondary | ICD-10-CM

## 2013-06-05 DIAGNOSIS — C50911 Malignant neoplasm of unspecified site of right female breast: Secondary | ICD-10-CM

## 2013-06-05 DIAGNOSIS — M25519 Pain in unspecified shoulder: Secondary | ICD-10-CM | POA: Insufficient documentation

## 2013-06-05 LAB — COMPREHENSIVE METABOLIC PANEL (CC13)
AST: 26 U/L (ref 5–34)
Anion Gap: 11 mEq/L (ref 3–11)
BUN: 22.3 mg/dL (ref 7.0–26.0)
CO2: 26 mEq/L (ref 22–29)
Calcium: 9.3 mg/dL (ref 8.4–10.4)
Chloride: 100 mEq/L (ref 98–109)
Creatinine: 0.7 mg/dL (ref 0.6–1.1)
Total Bilirubin: 0.7 mg/dL (ref 0.20–1.20)

## 2013-06-05 LAB — CBC WITH DIFFERENTIAL/PLATELET
BASO%: 1.2 % (ref 0.0–2.0)
Basophils Absolute: 0.1 10*3/uL (ref 0.0–0.1)
EOS%: 2.5 % (ref 0.0–7.0)
Eosinophils Absolute: 0.2 10*3/uL (ref 0.0–0.5)
HCT: 39.4 % (ref 34.8–46.6)
HGB: 13 g/dL (ref 11.6–15.9)
LYMPH%: 21.2 % (ref 14.0–49.7)
MCH: 30.1 pg (ref 25.1–34.0)
MCHC: 33.1 g/dL (ref 31.5–36.0)
MCV: 90.8 fL (ref 79.5–101.0)
MONO#: 0.6 10*3/uL (ref 0.1–0.9)
MONO%: 9.4 % (ref 0.0–14.0)
NEUT#: 4.1 10*3/uL (ref 1.5–6.5)
NEUT%: 65.7 % (ref 38.4–76.8)
Platelets: 245 10*3/uL (ref 145–400)
RBC: 4.34 10*6/uL (ref 3.70–5.45)
RDW: 14 % (ref 11.2–14.5)
WBC: 6.3 10*3/uL (ref 3.9–10.3)
lymph#: 1.3 10*3/uL (ref 0.9–3.3)

## 2013-06-05 NOTE — Progress Notes (Signed)
Patient ID: Melinda Martinez, female   DOB: [birth year], 72 y.o.   MRN: [id number]  No chief complaint on file.   HPI Melinda Martinez is a 72 y.o. female.  We are asked to see the patient in consultation by Dr. Deboraha Sprang to evaluate her for a right breast cancer. She recently went for a routine screening mammogram at which time an abnormality was found in the lateral right breast. By ultrasound this measured 9 mm. By MRI this area measured 4.7 cm and connected to the nipple. She was also found to have a lesion in her right rib. She denies any breast pain. She has had a history of shingles which has caused her to have intermittent sharp pains in the right breast. She's also had a couple of needle biopsies of the right breast in [place]. The last one was done 12 years ago and shortly after that her right nipple became inverted. 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 She also has a lot of different cancers on both sides of her family . 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 Breast cancer   . Hypertension   . 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 Complete rotator cuff tear of left shoulder     x2  . Fracture of both wrists     Past Surgical History  Procedure Laterality Date  . Tonsillectomy    . Abdominal hysterectomy      Family History  Problem Relation Age of Onset  . Breast cancer Mother   . Uterine cancer Mother   . Stroke Father   . Bladder Cancer Father   . Colon cancer Father     Social History History  Substance Use Topics  . Smoking status: Former Games developer  . Smokeless tobacco: Not on file  . Alcohol Use: Yes    Allergies  Allergen Reactions  . Penicillins Hives and Shortness Of Breath  . Acetaminophen Other (See Comments)    Severe chest pains   . Codeine Nausea And Vomiting and Other (See Comments)    Makes her unable to  sleep  . Meperidine Hcl Nausea And Vomiting  . Monosodium Glutamate Other (See Comments)    Severe headaches  . Biphosphate Other (See Comments)    Gastrointestinal issues   . Demerol [Meperidine]   . Erythromycin   . Hctz [Hydrochlorothiazide] Other (See Comments)    Joint pain  . Pentazocine Lactate Hives    Current Outpatient Prescriptions  Medication Sig Dispense Refill  . cholecalciferol (VITAMIN D) 1000 UNITS tablet Take 2,000 Units by mouth daily. X 6DAYS/WEEK      . estradiol (ESTRACE VAGINAL) 0.1 MG/GM vaginal cream Place 1 Applicatorful vaginally 2 (two) times a week. Vaginal Premarin, 0.5gr      . lisinopril (PRINIVIL,ZESTRIL) 10 MG tablet Take 20 mg by mouth.      . potassium chloride SA (K-DUR,KLOR-CON) 20 MEQ tablet Take 20 mEq by mouth once.      . Vitamin D, Ergocalciferol, (DRISDOL) 50000 UNITS CAPS capsule Take 50,000 Units by mouth every 7 (seven) days.       No current facility-administered medications for this visit.    Review of Systems Review of Systems  Constitutional: Negative.   HENT: Negative.   Eyes: Negative.   Respiratory: Negative.   Cardiovascular: Negative.  Gastrointestinal: Negative.   Endocrine: Negative.   Genitourinary: Negative.   Musculoskeletal: Positive for gait problem.  Skin: Negative.   Allergic/Immunologic: Negative.   Neurological: Negative.   Hematological: Negative.   Psychiatric/Behavioral: Negative.     There were no vitals taken for this visit.  Physical Exam Physical Exam  Constitutional: She is oriented to person, place, and time. She appears well-developed and well-nourished.  HENT:  Head: Normocephalic and atraumatic.  Eyes: Conjunctivae and EOM are normal. Pupils are equal, round, and reactive to light.  Neck: Normal range of motion. Neck supple.  Cardiovascular: Normal rate, regular rhythm and normal heart sounds.   Pulmonary/Chest: Effort normal and breath sounds normal.  She has some bruising of the  lateral right breast. There is inversion of the right nipple. There is fullness in the lateral right breast it is difficult to tell whether this is palpable mass or bruising. There is no palpable mass in the left breast. There is no palpable axillary, supraclavicular, or cervical lymphadenopathy  Abdominal: Soft. Bowel sounds are normal. She exhibits no mass. There is no tenderness.  Musculoskeletal: Normal range of motion.  Lymphadenopathy:    She has no cervical adenopathy.  Neurological: She is alert and oriented to person, place, and time.  Skin: Skin is warm and dry.  Psychiatric: She has a normal mood and affect. Her behavior is normal.    Data Reviewed As above  Assessment    The patient appears to have a large cancer in the right breast with a lesion in a rib of her right chest wall which has not been evaluated yet. This could be evidence of metastatic disease.     Plan    At this point she will need a metastatic workup. She will also require a Port-A-Cath for neoadjuvant therapy. I've discussed with her in detail the risks and benefits of placing the port as well as some of the technical aspects and she understands and wishes to proceed. She will probably also need a genetics evaluation. She favors bilateral mastectomies. We will await to see what her metastatic workup shows prior to deciding on her final surgery.        TOTH III,PAUL S 06/05/2013, 10:50 AM

## 2013-06-05 NOTE — Progress Notes (Signed)
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 female  who presented for a right screening mammogram. She was found to have a 1 cm mass in the right breast. Ultrasound confirmed a 9 x 8 x 7 mm mass. MRI showed a 4.2 x 1.6 x 1.7 cm mass with nipple retraction. It also showed rib lesions concerning for metastatic disease. A followup PET or CT was recommended. 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 She does have a history of needle biopsy of the right breast. At one done 12 years ago cause her right nipple to be inverted. 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 She has a strong family history of malignancy. She had menses at 10 and has used hormone replacement for 25 years. She had a total hysterectomy in 1980. She is using currently vaginal Premarin 2 times a week. She is GX P0 and accompanied by her brother.   She has minimal soreness after the biopsy. She is interested in pursuing treatment "as quickly as possible"  PREVIOUS RADIATION THERAPY: No  PAST MEDICAL HISTORY:  has a past medical history of Breast cancer; Hypertension; AKA (above knee amputation) (1953); Complete rotator cuff tear of left shoulder; and Fracture of both wrists.    PAST SURGICAL HISTORY: Past Surgical History  Procedure Laterality Date  . Tonsillectomy    . Abdominal hysterectomy      FAMILY HISTORY:  Family History  Problem Relation Age of Onset  . Breast cancer Mother   . Uterine cancer Mother   . Stroke Father   . Bladder Cancer Father   . Colon cancer  Father     SOCIAL HISTORY:  History  Substance Use Topics  . Smoking status: Former Games developer  . Smokeless tobacco: Not on file  . Alcohol Use: Yes    ALLERGIES: Penicillins; Acetaminophen; Codeine; Meperidine hcl; Monosodium glutamate; Biphosphate; Demerol; Erythromycin; Hctz; and Pentazocine lactate  MEDICATIONS:  Current Outpatient Prescriptions  Medication Sig Dispense Refill  . cholecalciferol (VITAMIN D) 1000 UNITS tablet Take 2,000 Units by mouth daily. X 6DAYS/WEEK      . estradiol (ESTRACE VAGINAL) 0.1 MG/GM vaginal cream Place 1 Applicatorful vaginally 2 (two) times a week. Vaginal Premarin, 0.5gr      . lisinopril (PRINIVIL,ZESTRIL) 10 MG tablet Take 20 mg by mouth.      . potassium chloride SA (K-DUR,KLOR-CON) 20 MEQ tablet Take 20 mEq by mouth once.      . Vitamin D, Ergocalciferol, (DRISDOL) 50000 UNITS CAPS capsule Take 50,000 Units by mouth every 7 (seven) days.       No current facility-administered medications for this encounter.    REVIEW OF SYSTEMS:  A 15 point review of systems is documented in the electronic medical record. This was obtained by the nursing staff. However, I reviewed this with the patient to discuss relevant findings and make appropriate changes.  Pertinent items are noted in HPI.  PHYSICAL EXAM: There were no vitals filed for this visit.. . She is a pleasant female who appears younger than her stated age.  She is alert and oriented x3.  LABORATORY DATA:  Lab Results  Component Value Date   WBC 6.3 06/05/2013   HGB 13.0 06/05/2013   HCT 39.4 06/05/2013   MCV 90.8 06/05/2013   PLT 245 06/05/2013   Lab Results  Component Value Date   NA 138 06/05/2013   K 3.5 06/05/2013   CL 108 01/05/2009   CO2 26 06/05/2013   Lab Results  Component Value Date   ALT 33 06/05/2013   AST 26 06/05/2013   ALKPHOS 64 06/05/2013   BILITOT 0.70 06/05/2013     RADIOGRAPHY: US Breast Right  05/24/2013   CLINICAL DATA:  Screening recall for right breast  distortion seen on tomosynthesis. The patient does have a history of benign excisional biopsies of the right breast.  EXAM: ULTRASOUND RIGHT BREAST  COMPARISON:  Previous exams.  FINDINGS: Physical examination of the outer right breast does not reveal any palpable masses, however the patient does describe focal tenderness at 9 o'clock 2-3 cm from the nipple.  Targeted ultrasound of the right breast was performed demonstrating a hypoechoic mass with irregular margins at 9 o'clock 2 cm from the nipple measuring 0.9 x 0.8 x 0.7 cm. This likely corresponds with mammography findings.  IMPRESSION: Suspicious right breast mass at 9 o'clock 2 cm from the nipple.  RECOMMENDATION: Ultrasound-guided biopsy of the right breast mass at 9 o'clock is recommended. This is scheduled for Monday November 17th at 9 a.m.  I have discussed the findings and recommendations with the patient. Results were also provided in writing at the conclusion of the visit. If applicable, a reminder letter will be sent to the patient regarding the next appointment.  BI-RADS CATEGORY  4: Suspicious abnormality - biopsy should be considered.   Electronically Signed   By: Edwin Cap M.D.   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  EXAM: MR BILATERAL BREAST WITHOUT AND WITH CONTRAST  LABS:  BUN and creatinine were obtained on site at [facility] at [address] Results: BUN 14 mg/dL, Creatinine 0.7 mg/dL.  TECHNIQUE: Multiplanar, multisequence MR images of both breasts were obtained prior to and following the intravenous administration of 15ml of MultiHance.  THREE-DIMENSIONAL MR IMAGE RENDERING ON INDEPENDENT WORKSTATION:  Three-dimensional MR images were rendered by post-processing of the original MR data on an independent workstation. The three-dimensional MR  images were interpreted, and findings are reported in the following complete MRI report for this study.  COMPARISON:  Previous exams  FINDINGS: Breast composition: c. Heterogeneous fibroglandular tissue  Background parenchymal enhancement: Moderate  Right breast: In the middle third of the lateral aspect of the right breast there is a spiculated mass with associated anterior linear enhancement that extends to the nipple causing nipple retraction. The enhancing mass and anterior linear enhancement measures 4.2 x 1.6 x 1.7 cm in anterior-posterior, transverse and longitudinal dimensions. There is an associated biopsy clip artifact in the mass.  Left breast: No mass or abnormal enhancement.  Lymph nodes: No abnormal appearing lymph nodes.  Ancillary findings: There is 2.0 cm of abnormal enhancement in the right chest worrisome for a possible metastatic lesion in a rib.  IMPRESSION: In the 9 o'clock region of the right breast there is a spiculated mass with linear enhancement extending to the nipple causing nipple retraction corresponding with  the known invasive ductal carcinoma and DCIS.  Abnormal enhancement in the right chest worrisome for metastatic disease in a rib.  RECOMMENDATION: Treatment planning of the known right breast cancer and DCIS as is recommended. The enhancement extends to the nipple worrisome for nipple involvement.  PET-CT is recommended for possible metastatic disease in a right rib.  BI-RADS CATEGORY  6: Known biopsy-proven malignancy - appropriate action should be taken.   Electronically Signed   By: Baird Lyons M.D.   On: 06/03/2013 13:49   Ct Virtual Colonoscopy Diagnostic  05/22/2013   CLINICAL DATA:  Incomplete colonoscopy secondary to tortuous colon. Strong family history of colon cancer.  EXAM: CT VIRTUAL COLONOSCOPY DIAGNOSTIC  TECHNIQUE: The patient was given a standard max citrate bowel preparation with Gastrografin and barium for fluid and stool tagging respectively. The quality of  the bowel preparation is moderate. Automated CO2 insufflation of the colon was performed prior to image acquisition and colonic distention is moderate. Image post processing was used to generate a 3D endoluminal fly-through projection of the colon and to electronically subtract stool/fluid as appropriate.  COMPARISON:  None.  FINDINGS: VIRTUAL COLONOSCOPY:  Long segment of sigmoid colon was underdistended on supine series but improved on prone series. Associated sigmoid diverticulosis.  Tortuous sigmoid and transverse colon.  Moderate layering fluid/debris throughout the colon. Multiple polypoid lesions, predominantly in the transverse colon, all of which correspond to high density stool/debris.  No significant colonic polyp, mass, or stricture is seen.  CT ABDOMEN AND PELVIS WITHOUT CONTRAST:  Lung bases are clear.  Unenhanced liver, spleen, pancreas, and adrenal glands are within normal limits.  Gallbladder is underdistended. No intrahepatic or extrahepatic ductal dilatation.  Kidneys are within normal limits. No renal calculi or hydronephrosis.  No evidence of bowel obstruction. Normal appendix. Sigmoid diverticulosis.  Atherosclerotic calcifications of the abdominal aorta and branch vessels.  No abdominopelvic ascites.  No suspicious abdominopelvic lymphadenopathy.  Status post hysterectomy.  No adnexal masses.  Bladder is underdistended.  Degenerative changes of the visualized thoracolumbar spine. Grade 1 anterolisthesis of L4 on L5.  IMPRESSION: No significant colonic polyp, mass, or stricture is seen.  Virtual colonoscopy is not designed to detect diminutive polyps (i.e., less than or equal to 5 mm), the presence or absence of which may not affect clinical management.  Sigmoid diverticulosis.   Electronically Signed   By: Charline Bills M.D.   On: 05/22/2013 09:33   Mm Digital Diagnostic Unilat R  05/27/2013   CLINICAL DATA:  Status post ultrasound-guided core needle biopsy of a 9 mm 9 o'clock right  breast mass  EXAM: DIGITAL DIAGNOSTIC UNILATERAL RIGHT MAMMOGRAM  COMPARISON:  Previous exams.  FINDINGS: Films are performed following ultrasound guided biopsy of a 9 mm mass in the 9 o'clock position of the right breast. These demonstrate a T-shaped biopsy marker clip at the location of the recently demonstrated mass.  IMPRESSION: Appropriate clip deployment following right breast ultrasound-guided core needle biopsy.  Final Assessment: Post Procedure Mammograms for Marker Placement   Electronically Signed   By: Gordan Payment M.D.   On: 05/27/2013 10:33   Mm Digital Screening  05/17/2013   CLINICAL DATA:  Screening.  EXAM: DIGITAL SCREENING BILATERAL MAMMOGRAM WITH CAD  DIGITAL BREAST TOMOSYNTHESIS  Digital breast tomosynthesis images are acquired in two projections. These images are reviewed in combination with the digital mammogram, confirming the findings below.  COMPARISON:  Previous Exam(s)  ACR Breast Density Category c: The breasts are heterogeneously dense, which may obscure  small masses.  FINDINGS: In the right breast, possible distortion warrants further evaluation with ultrasound. In the left breast, no suspicious masses or malignant type calcifications are identified. Images were processed with CAD.  IMPRESSION: Further evaluation is suggested for possible distortion in the right breast.  RECOMMENDATION: Ultrasound of the right breast. (Code:[id number]-41M)  The patient will be contacted regarding the findings, and additional imaging will be scheduled.  BI-RADS CATEGORY  0: Incomplete. Need additional imaging evaluation and/or prior mammograms for comparison.   Electronically Signed   By: Britta Mccreedy M.D.   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 This is concordant with the imaging findings.  The final pathological diagnosis was discussed with the patient by telephone on 05/28/2013. Her questions were answered. She reported some pain and bruising at the biopsy site with no palpable hematoma. She was given an appointment at [facility] on 06/05/2013. MRI of the breasts will be scheduled.   Electronically Signed   By: Gordan Payment M.D.   On: 05/28/2013 16:35   05/28/2013   CLINICAL DATA:  9 mm mass in the 9 o'clock position of the right breast at recent mammography and ultrasound.  EXAM: US BREAST BX W LOC DEV 1ST LESION IMG BX SPEC US GUIDE*R*  COMPARISON:  Previous exams.  PROCEDURE: I met with the patient and we discussed the procedure of ultrasound-guided biopsy, including benefits and alternatives. We discussed the high likelihood of a successful procedure. We discussed the risks of the procedure including infection, bleeding, tissue injury, clip migration, and inadequate sampling. Informed written consent was given. The usual time-out protocol was performed immediately prior to the procedure.  Using sterile technique and 2% Lidocaine as local anesthetic, under direct ultrasound visualization, a 12 gauge vacuum-assisteddevice was used to perform biopsy of the recently demonstrated 9 mm mass in the 9 o'clock position of the right breast using an inferior approach. At the conclusion of the procedure, a T-shaped tissue marker clip was deployed into the biopsy cavity. Follow-up 2-view mammogram was performed and dictated separately.  IMPRESSION: Ultrasound-guided biopsy of a 9 mm 9 o'clock right breast mass. No apparent complications.  Electronically Signed: By: Gordan Payment M.D. On: 05/27/2013 10:32      IMPRESSION: T3 N0 possibly M1 invasive ductal carcinoma of the right breast  PLAN: I spoke with the patient and her brother today in broad terms regarding the role of radiation in the management of breast cancer. We discussed that patients who choose to have lumpectomy have  radiation as part of that decision due to the decrease in local failure seen in patients who undergo radiation versus those who do not. We discussed the role of radiation in the postmastectomy setting for patients who have a positive lymph nodes or tumors greater than 5 cm. Currently she does not meet these criteria and is interested in pursuing a mastectomy if she does not have metastatic disease on her PET scan chest CT given these lesions seen in her right rib. She is going to have staging studies performed and followup with medical oncology. She is going to have a port placed and possibly neoadjuvant or adjuvant chemotherapy. She is going to be referred for genetics. I will plan on seeing her if she has radiation indicated at the end  of this workup. 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 She also had a sister who was recently treated with postmastectomy radiation in the [facility] and she was concerned about her sisters skin reaction. I try to alleviate these concerns by providing her information regarding our patient teaching. I spent 60 minutes  face to face with the patient and more than 50% of that time was spent in counseling and/or coordination of care.   ------------------------------------------------  Lurline Hare, MD

## 2013-06-05 NOTE — Progress Notes (Signed)
Checked in new patient with no financial issues. She has not been to [place] and she has her appt card. I gave her the [facility] packet.

## 2013-06-05 NOTE — Progress Notes (Signed)
Mailed after appt letter to pt. 

## 2013-06-05 NOTE — Progress Notes (Signed)
Faxed Care Plan to PCP. 

## 2013-06-05 NOTE — Progress Notes (Signed)
ID: Melinda Martinez OB: Sep 27, 1940  MR#: [id number]  CSN#:[id number]  PCP: Katy Apo, MD GYN:  Jeani Sow, NP SU: Felicity Pellegrini OTHER MD: Lurline Hare, Jeannett Senior numerous  CHIEF COMPLAINT: But I wanted my breast tissue gone"  HISTORY OF PRESENT ILLNESS: Melinda Martinez had bilateral screening mammography at the breast Center 05/14/2013 showing a possible distortion in the right breast. Ultrasound of the right breast 05/27/2013 confirmed a hypoechoic mass measuring 0.9 cm at the 9:00 position. This was not palpable.  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 The copy number was 7.15  On 06/03/2013 the patient underwent bilateral breast MRI. This showed, in the right breast, a spiculated mass associated with an area of linear enhancement extending to the nipple and causing nipple retraction. The entire area measures 4.2 cm. There were no abnormal appearing lymph nodes and there was no other finding in either breast. However there is a 2 cm abnormal area of enhancement in the right chest wall, likely a rate, worrisome for metastatic disease.  The patient's subsequent history is as detailed below  INTERVAL HISTORY: And again was evaluated in the multidisciplinary breast cancer clinic 06/05/2013, accompanied by her brother Melinda Martinez. Her case was also discussed at the breast conference that morning.  REVIEW OF SYSTEMS: There were no unusual symptoms leading to the myelogram, which was routine. She has had left shoulder discomfort for some time now, and she attributes this to a rotator cuff problems. This has been ongoing for about 6 months. She is mildly fatigued. She has small cataracts. She has a "minimally irregular heartbeat". She can be short of breath when walking up stairs, and keeps a dry  cough. Just low back pain due to osteoporosis involving particularly the left hip area. A detailed review of systems today was otherwise negative  PAST MEDICAL HISTORY: Past Medical History  Diagnosis Date  . Breast cancer   . Hypertension   . 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 Complete rotator cuff tear of left shoulder     x2  . Fracture of both wrists     PAST SURGICAL HISTORY: Past Surgical History  Procedure Laterality Date  . Tonsillectomy    . Abdominal hysterectomy      FAMILY HISTORY Family History  Problem Relation Age of Onset  . Breast cancer Mother   . Uterine cancer Mother   . Stroke Father   . Bladder Cancer Father   . Colon cancer Father    the patient's father died at the age of 45 with a history of bladder and colon cancer. The cause of death though was a stroke. The patient's mother died at the age of 22 general history of breast cancer diagnosed at age 86. She later developed uterine cancer and later developed colon cancer. The patient had 4 brothers and 6 sisters. Genetics counseling is planned  GYNECOLOGIC HISTORY:  Menarche age 44, she is GX P0. She has been using vaginal Premarin for approximately 25 years. 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 She is divorced and lives by herself. Her brother Melinda Martinez, present at the initial visit, works  for an auto parts company.    ADVANCED DIRECTIVES: Not in place   HEALTH MAINTENANCE: History  Substance Use Topics  . Smoking status: Former Games developer  . Smokeless tobacco: Not on file  . Alcohol Use: Yes     Colonoscopy: Virtual colonoscopy 05/20/2013 was negative except for sigmoid diverticulosis  PAP: 2013  Bone density: October 2009 showed osteopenia with a T score of -2.2 at the right femur  Lipid panel:  Allergies  Allergen  Reactions  . Penicillins Hives and Shortness Of Breath  . Acetaminophen Other (See Comments)    Severe chest pains   . Codeine Nausea And Vomiting and Other (See Comments)    Makes her unable to sleep  . Meperidine Hcl Nausea And Vomiting  . Monosodium Glutamate Other (See Comments)    Severe headaches  . Biphosphate Other (See Comments)    Gastrointestinal issues   . Demerol [Meperidine]   . Erythromycin   . Hctz [Hydrochlorothiazide] Other (See Comments)    Joint pain  . Pentazocine Lactate Hives    Current Outpatient Prescriptions  Medication Sig Dispense Refill  . cholecalciferol (VITAMIN D) 1000 UNITS tablet Take 2,000 Units by mouth daily. X 6DAYS/WEEK      . estradiol (ESTRACE VAGINAL) 0.1 MG/GM vaginal cream Place 1 Applicatorful vaginally 2 (two) times a week. Vaginal Premarin, 0.5gr      . lisinopril (PRINIVIL,ZESTRIL) 10 MG tablet Take 20 mg by mouth.      . potassium chloride SA (K-DUR,KLOR-CON) 20 MEQ tablet Take 20 mEq by mouth once.      . Vitamin D, Ergocalciferol, (DRISDOL) 50000 UNITS CAPS capsule Take 50,000 Units by mouth every 7 (seven) days.       No current facility-administered medications for this visit.    OBJECTIVE: Middle-aged white woman who appears well; right leg prosthesis in place Filed Vitals:   06/05/13 0839  BP: 168/82  Pulse: 82  Temp: 98 F (36.7 C)  Resp: 18     Body mass index is 26.38 kg/(m^2).    ECOG FS:0 - Asymptomatic  Ocular: Sclerae unicteric, pupils equal, round and reactive to light Ear-nose-throat: Oropharynx clear, dentition fair Lymphatic: No cervical or supraclavicular adenopathy Lungs no rales or rhonchi, good excursion bilaterally Heart regular rate and rhythm, no murmur appreciated Abd soft, nontender, positive bowel sounds MSK no focal spinal tenderness, right breast prosthesis as noted Neuro: non-focal, well-oriented, appropriate affect Breasts: The right breast is status post recent biopsy. There is a mild  ecchymosis. I do not palpate a well-defined mass. The right axilla is benign. The left breast is unremarkable.   LAB RESULTS:  CMP     Component Value Date/Time   NA 138 06/05/2013 0822   NA 140 01/05/2009 1055   K 3.5 06/05/2013 0822   K 3.5 01/05/2009 1055   CL 108 01/05/2009 1055   CO2 26 06/05/2013 0822   CO2 28 01/05/2009 1055   GLUCOSE 97 06/05/2013 0822   GLUCOSE 105* 01/05/2009 1055   BUN 22.3 06/05/2013 0822   BUN 15 01/05/2009 1055   CREATININE 0.7 06/05/2013 0822   CREATININE 0.72 01/05/2009 1055   CALCIUM 9.3 06/05/2013 0822   CALCIUM 8.3* 01/05/2009 1055   PROT 6.8 06/05/2013 0822   PROT 6.9 03/25/2008 1038   ALBUMIN 3.8 06/05/2013 0822   ALBUMIN 4.1 03/25/2008 1038   AST 26 06/05/2013 0822   AST 26 03/25/2008 1038   ALT 33 06/05/2013 0822   ALT 23 03/25/2008 1038   ALKPHOS  64 06/05/2013 0822   ALKPHOS 63 03/25/2008 1038   BILITOT 0.70 06/05/2013 0822   BILITOT 1.0 03/25/2008 1038   GFRNONAA >60 01/05/2009 1055   GFRAA  Value: >60        The eGFR has been calculated using the MDRD equation. This calculation has not been validated in all clinical situations. eGFR's persistently <60 mL/min signify possible Chronic Kidney Disease. 01/05/2009 1055    I No results found for this basename: SPEP, UPEP,  kappa and lambda light chains    Lab Results  Component Value Date   WBC 6.3 06/05/2013   NEUTROABS 4.1 06/05/2013   HGB 13.0 06/05/2013   HCT 39.4 06/05/2013   MCV 90.8 06/05/2013   PLT 245 06/05/2013      Chemistry      Component Value Date/Time   NA 138 06/05/2013 0822   NA 140 01/05/2009 1055   K 3.5 06/05/2013 0822   K 3.5 01/05/2009 1055   CL 108 01/05/2009 1055   CO2 26 06/05/2013 0822   CO2 28 01/05/2009 1055   BUN 22.3 06/05/2013 0822   BUN 15 01/05/2009 1055   CREATININE 0.7 06/05/2013 0822   CREATININE 0.72 01/05/2009 1055      Component Value Date/Time   CALCIUM 9.3 06/05/2013 0822   CALCIUM 8.3* 01/05/2009 1055   ALKPHOS 64 06/05/2013 0822   ALKPHOS  63 03/25/2008 1038   AST 26 06/05/2013 0822   AST 26 03/25/2008 1038   ALT 33 06/05/2013 0822   ALT 23 03/25/2008 1038   BILITOT 0.70 06/05/2013 0822   BILITOT 1.0 03/25/2008 1038       No results found for this basename: LABCA2    No components found with this basename: LABCA125    No results found for this basename: INR,  in the last 168 hours  Urinalysis No results found for this basename: colorurine, appearanceur, labspec, phurine, glucoseu, hgbur, bilirubinur, ketonesur, proteinur, urobilinogen, nitrite, leukocytesur    STUDIES: US Breast Right  05/24/2013   CLINICAL DATA:  Screening recall for right breast distortion seen on tomosynthesis. The patient does have a history of benign excisional biopsies of the right breast.  EXAM: ULTRASOUND RIGHT BREAST  COMPARISON:  Previous exams.  FINDINGS: Physical examination of the outer right breast does not reveal any palpable masses, however the patient does describe focal tenderness at 9 o'clock 2-3 cm from the nipple.  Targeted ultrasound of the right breast was performed demonstrating a hypoechoic mass with irregular margins at 9 o'clock 2 cm from the nipple measuring 0.9 x 0.8 x 0.7 cm. This likely corresponds with mammography findings.  IMPRESSION: Suspicious right breast mass at 9 o'clock 2 cm from the nipple.  RECOMMENDATION: Ultrasound-guided biopsy of the right breast mass at 9 o'clock is recommended. This is scheduled for Monday November 17th at 9 a.m.  I have discussed the findings and recommendations with the patient. Results were also provided in writing at the conclusion of the visit. If applicable, a reminder letter will be sent to the patient regarding the next appointment.  BI-RADS CATEGORY  4: Suspicious abnormality - biopsy should be considered.   Electronically Signed   By: Edwin Cap M.D.   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  EXAM: MR BILATERAL BREAST WITHOUT AND WITH CONTRAST  LABS:  BUN and creatinine were obtained on site at [facility] at [address] Results: BUN 14 mg/dL, Creatinine 0.7 mg/dL.  TECHNIQUE: Multiplanar, multisequence MR images of both breasts were obtained prior to and following the intravenous administration of 15ml of MultiHance.  THREE-DIMENSIONAL MR IMAGE RENDERING ON INDEPENDENT WORKSTATION:  Three-dimensional MR images were rendered by post-processing of the original MR data on an independent workstation. The three-dimensional MR images were interpreted, and findings are reported in the following complete MRI report for this study.  COMPARISON:  Previous exams  FINDINGS: Breast composition: c. Heterogeneous fibroglandular tissue  Background parenchymal enhancement: Moderate  Right breast: In the middle third of the lateral aspect of the right breast there is a spiculated mass with associated anterior linear enhancement that extends to the nipple causing nipple retraction. The enhancing mass and anterior linear enhancement measures 4.2 x 1.6 x 1.7 cm in anterior-posterior, transverse and longitudinal dimensions. There is an associated biopsy clip artifact in the mass.  Left breast: No mass or abnormal enhancement.  Lymph nodes: No abnormal appearing lymph nodes.  Ancillary findings: There is 2.0 cm of abnormal enhancement in the right chest worrisome for a possible metastatic lesion in a rib.  IMPRESSION: In the 9 o'clock region of the right breast there is a spiculated mass with linear enhancement extending to the nipple causing nipple retraction corresponding with the known invasive ductal carcinoma and DCIS.  Abnormal enhancement in the right chest worrisome for metastatic disease in a rib.  RECOMMENDATION: Treatment planning of the known right breast cancer and DCIS as is recommended. The enhancement extends to  the nipple worrisome for nipple involvement.  PET-CT is recommended for possible metastatic disease in a right rib.  BI-RADS CATEGORY  6: Known biopsy-proven malignancy - appropriate action should be taken.   Electronically Signed   By: Baird Lyons M.D.   On: 06/03/2013 13:49   Ct Virtual Colonoscopy Diagnostic  05/22/2013   CLINICAL DATA:  Incomplete colonoscopy secondary to tortuous colon. Strong family history of colon cancer.  EXAM: CT VIRTUAL COLONOSCOPY DIAGNOSTIC  TECHNIQUE: The patient was given a standard max citrate bowel preparation with Gastrografin and barium for fluid and stool tagging respectively. The quality of the bowel preparation is moderate. Automated CO2 insufflation of the colon was performed prior to image acquisition and colonic distention is moderate. Image post processing was used to generate a 3D endoluminal fly-through projection of the colon and to electronically subtract stool/fluid as appropriate.  COMPARISON:  None.  FINDINGS: VIRTUAL COLONOSCOPY:  Long segment of sigmoid colon was underdistended on supine series but improved on prone series. Associated sigmoid diverticulosis.  Tortuous sigmoid and transverse colon.  Moderate layering fluid/debris throughout the colon. Multiple polypoid lesions, predominantly in the transverse colon, all of which correspond to high density stool/debris.  No significant colonic polyp, mass, or stricture is seen.  CT ABDOMEN AND PELVIS WITHOUT CONTRAST:  Lung bases are clear.  Unenhanced liver, spleen, pancreas, and adrenal glands are within normal limits.  Gallbladder is underdistended. No intrahepatic or extrahepatic ductal dilatation.  Kidneys are within normal limits. No renal calculi or hydronephrosis.  No evidence of bowel obstruction. Normal appendix. Sigmoid diverticulosis.  Atherosclerotic calcifications of the abdominal aorta and branch vessels.  No abdominopelvic ascites.  No suspicious abdominopelvic lymphadenopathy.  Status post  hysterectomy.  No adnexal masses.  Bladder is underdistended.  Degenerative changes of the visualized thoracolumbar spine. Grade 1 anterolisthesis of L4 on L5.  IMPRESSION: No significant colonic polyp, mass,  or stricture is seen.  Virtual colonoscopy is not designed to detect diminutive polyps (i.e., less than or equal to 5 mm), the presence or absence of which may not affect clinical management.  Sigmoid diverticulosis.   Electronically Signed   By: Charline Bills M.D.   On: 05/22/2013 09:33   Mm Digital Diagnostic Unilat R  05/27/2013   CLINICAL DATA:  Status post ultrasound-guided core needle biopsy of a 9 mm 9 o'clock right breast mass  EXAM: DIGITAL DIAGNOSTIC UNILATERAL RIGHT MAMMOGRAM  COMPARISON:  Previous exams.  FINDINGS: Films are performed following ultrasound guided biopsy of a 9 mm mass in the 9 o'clock position of the right breast. These demonstrate a T-shaped biopsy marker clip at the location of the recently demonstrated mass.  IMPRESSION: Appropriate clip deployment following right breast ultrasound-guided core needle biopsy.  Final Assessment: Post Procedure Mammograms for Marker Placement   Electronically Signed   By: Gordan Payment M.D.   On: 05/27/2013 10:33   Mm Digital Screening  05/17/2013   CLINICAL DATA:  Screening.  EXAM: DIGITAL SCREENING BILATERAL MAMMOGRAM WITH CAD  DIGITAL BREAST TOMOSYNTHESIS  Digital breast tomosynthesis images are acquired in two projections. These images are reviewed in combination with the digital mammogram, confirming the findings below.  COMPARISON:  Previous Exam(s)  ACR Breast Density Category c: The breasts are heterogeneously dense, which may obscure small masses.  FINDINGS: In the right breast, possible distortion warrants further evaluation with ultrasound. In the left breast, no suspicious masses or malignant type calcifications are identified. Images were processed with CAD.  IMPRESSION: Further evaluation is suggested for possible distortion in  the right breast.  RECOMMENDATION: Ultrasound of the right breast. (Code:[id number])  The patient will be contacted regarding the findings, and additional imaging will be scheduled.  BI-RADS CATEGORY  0: Incomplete. Need additional imaging evaluation and/or prior mammograms for comparison.   Electronically Signed   By: Britta Mccreedy M.D.   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 This is concordant with the imaging findings. The final pathological diagnosis was discussed with the patient by telephone on 05/28/2013. Her questions were answered. She reported some pain and bruising at the biopsy site with no palpable hematoma. She was given an appointment at the Multidisciplinary Clinic on 06/05/2013. MRI of the breasts will be scheduled.   Electronically Signed   By: Gordan Payment M.D.   On: 05/28/2013 16:35   05/28/2013   CLINICAL DATA:  9 mm mass in the 9 o'clock position of the right breast at recent mammography and ultrasound.  EXAM: US BREAST BX W LOC DEV 1ST LESION IMG BX SPEC US GUIDE*R*  COMPARISON:  Previous exams.  PROCEDURE: I met with the patient and we discussed the procedure of ultrasound-guided biopsy, including benefits and alternatives. We discussed the high likelihood of a successful procedure. We discussed the risks of the procedure including infection, bleeding, tissue injury, clip migration, and inadequate sampling. Informed written consent was given. The usual time-out protocol was performed immediately prior to the procedure.  Using sterile technique and 2% Lidocaine as local anesthetic, under direct ultrasound visualization, a 12 gauge vacuum-assisteddevice was used to perform biopsy of the recently demonstrated 9 mm mass in the 9 o'clock position of the right breast using an inferior  approach.  At the conclusion of the procedure, a T-shaped tissue marker clip was deployed into the biopsy cavity. Follow-up 2-view mammogram was performed and dictated separately.  IMPRESSION: Ultrasound-guided biopsy of a 9 mm 9 o'clock right breast mass. No apparent complications.  Electronically Signed: By: Gordan Payment M.D. On: 05/27/2013 10:32    ASSESSMENT: 72 y.o. [place] woman status post right breast biopsy 05/27/2013 for a clinical T2 N0, stage IIA invasive ductal carcinoma, grade 2 or 3, estrogen receptor 80% positive, progesterone receptor 11% positive, with an MIB-1 of 81%, and with HER-2 amplification by CISH with a ratio of 2.55 and an average copy number of 7.51  (1) MRI shows a right rib cage abnormality worrisome for metastatic disease  PLAN: We spent the better part of today's hour-long appointment discussing the biology of breast cancer in general, and the specifics of the patient's tumor in particular. Nesta Scaturro understands it may be difficult to do a partial lumpectomy without significant cosmetic issues. Nevertheless our recommendation is that she consider neoadjuvant chemotherapy followed by lumpectomy and radiation, then followed by antiestrogen therapy. This is in accordance with NCCN guidelines.  However she is strongly considering bilateral mastectomies. She understands there is no survival advantage to mastectomy or lumpectomy, and the main reason she is considering this is to "be done with it" in terms of future mammograms, possibly future biopsies were further treatments. The cosmesis is not an issue for her but she would consider implant placement with no nipple reconstruction (she has decided that for at least).  We discussed her situation in terms of a stage II breast cancer, but she understands we have a finding in what looks like a rib which may indicate stage IV disease. She really has no symptoms suggestive of advanced breast cancer so I am hoping this will prove to  be a false-positive. I am setting her up for a PET scan and CT scan of the chest. If the PET scan cannot be done in a reasonable period of time, we will do a bone scan instead.  She will need a port, an echo, and her scans as just noted. She will need to come to chemotherapy school. Her treatment will consist of carboplatin and docetaxel given every 3 weeks x6 together with trastuzumab and pertuzumab, to be followed by surgery, to be followed by trastuzumab for 1 year. If she decides on surgery first, it would be essentially the same treatment.  Even though there is not a significant history of breast cancer in the family, her mother had a mixture of cancer suggestive of Lynch syndrome and I am referring Dejai Schubach 2 genetics to explore this further.  Tentatively I have made her return appointment here for the middle of next week, by which time I hope we will have results on the metastatic workup. Otherwise we will postpone the appointment.  Very Erskine Squibb understands at this point the goal of treatment is cure. She is fully in agreement with the plan as outlined above. She knows to call for any problems that may develop before next visit. Lowella Dell, MD   06/05/2013 1:35 PM

## 2013-06-05 NOTE — Telephone Encounter (Signed)
message to GM that pt has a conflict 12/3 and cannot see him at 11am due she will not be done w/her scans. waiting for reply.

## 2013-06-05 NOTE — Progress Notes (Signed)
Faxed Care Plan to Leigh at [facility]. 

## 2013-06-08 ENCOUNTER — Other Ambulatory Visit: Payer: Self-pay | Admitting: Oncology

## 2013-06-10 ENCOUNTER — Telehealth: Payer: Self-pay | Admitting: *Deleted

## 2013-06-10 ENCOUNTER — Encounter: Payer: Self-pay | Admitting: *Deleted

## 2013-06-10 NOTE — Telephone Encounter (Signed)
Left vm for pt to return call regarding [facility] from 06/05/13

## 2013-06-10 NOTE — Progress Notes (Signed)
[facility] Psychosocial Distress Screening Clinical Social Work  Patient completed distress screening protocol, and scored a 6 on the Psychosocial Distress Thermometer which indicates moderate distress. Clinical Social Worker met with pt in [facility] on 11/26 assess for distress and other psychosocial needs.  Pt stated she was doing "ok", but would feel "better" once she received the results from the additional test she was having.  CSW validated pt's feelings and offered additional support.  CSW informed pt of the support team and support services at [facility], and pt was agreeable to an alight guides referral.  CSW encouraged pt to call with any additional questions or concerns.     Tamala Julian, MSW, LCSW Clinical Social Worker [facility] [phone]

## 2013-06-11 ENCOUNTER — Telehealth: Payer: Self-pay | Admitting: *Deleted

## 2013-06-11 ENCOUNTER — Telehealth: Payer: Self-pay | Admitting: Oncology

## 2013-06-11 NOTE — Telephone Encounter (Signed)
s.w. pt and advised on chemo edu on 12.4.14.Marland KitchenMarland Kitchenpt ok and aware

## 2013-06-11 NOTE — Telephone Encounter (Signed)
Per staff message response from GM he will see pt on 12/12 due to scans were 12/3. Called pt today re appt and Per Ms. Bussey she has cx'd her scans and is going to the [facility] therefore this appt is not needed. Per pt she s/w Dawn who is coordinating this. The scans have already been cx'd and I cx'd the 12/12 f/u. Message to GM/Dawn/desk nurse via EPIC.

## 2013-06-11 NOTE — Telephone Encounter (Signed)
Spoke to pt concerning [facility] from 06/05/13.  Pt denies questions or concerns regarding dx or treatment care plan.  Encourage pt to call with needs.  Received verbal understanding.  Contact information given.

## 2013-06-12 ENCOUNTER — Encounter (HOSPITAL_COMMUNITY): Payer: Medicare Other

## 2013-06-12 ENCOUNTER — Ambulatory Visit (HOSPITAL_COMMUNITY): Payer: Medicare Other

## 2013-06-13 ENCOUNTER — Encounter: Payer: Self-pay | Admitting: *Deleted

## 2013-06-13 ENCOUNTER — Ambulatory Visit (HOSPITAL_BASED_OUTPATIENT_CLINIC_OR_DEPARTMENT_OTHER): Payer: Medicare Other | Admitting: Genetic Counselor

## 2013-06-13 ENCOUNTER — Other Ambulatory Visit: Payer: Medicare Other

## 2013-06-13 ENCOUNTER — Encounter: Payer: Self-pay | Admitting: Genetic Counselor

## 2013-06-13 ENCOUNTER — Other Ambulatory Visit: Payer: Medicare Other | Admitting: Lab

## 2013-06-13 DIAGNOSIS — Z8042 Family history of malignant neoplasm of prostate: Secondary | ICD-10-CM

## 2013-06-13 DIAGNOSIS — C50419 Malignant neoplasm of upper-outer quadrant of unspecified female breast: Secondary | ICD-10-CM

## 2013-06-13 DIAGNOSIS — IMO0002 Reserved for concepts with insufficient information to code with codable children: Secondary | ICD-10-CM

## 2013-06-13 DIAGNOSIS — Z800 Family history of malignant neoplasm of digestive organs: Secondary | ICD-10-CM

## 2013-06-13 DIAGNOSIS — Z803 Family history of malignant neoplasm of breast: Secondary | ICD-10-CM

## 2013-06-13 DIAGNOSIS — Z8049 Family history of malignant neoplasm of other genital organs: Secondary | ICD-10-CM

## 2013-06-13 DIAGNOSIS — C50411 Malignant neoplasm of upper-outer quadrant of right female breast: Secondary | ICD-10-CM

## 2013-06-13 DIAGNOSIS — Z8041 Family history of malignant neoplasm of ovary: Secondary | ICD-10-CM

## 2013-06-13 NOTE — Progress Notes (Signed)
Dr.  Raymond Gurney Magrinat requested a consultation for genetic counseling and risk assessment for BRISTYL MCLEES, a 72 y.o. female, for discussion of her personal history of breast cancer and family history of breast, colon, uterine, ovarian, prostate and testicular cancers.  She presents to clinic today to discuss the possibility of a genetic predisposition to cancer, and to further clarify her risks, as well as her family members' risks for cancer.   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  She had full body x-rays at least every 3 months looking for metastases. She feels that this could have been caused by DDT exposure as [place] and they would spray the house and animals with this. In 2014, at the age of 22, Skylynne Schlechter was diagnosed with breast cancer.  She will be treated at the [facility] in [place] for this cancer. She had a colonoscopy recently and did not find polyps.     Past Medical History  Diagnosis Date  . Breast cancer   . Hypertension   . 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 Complete rotator cuff tear of left shoulder     x2  . Fracture of both wrists   . Cancer 1952    Osteosarcoma    Past Surgical History  Procedure Laterality Date  . Tonsillectomy    . Abdominal hysterectomy      History   Social History  . Marital Status: Widowed    Spouse Name: N/A    Number of Children: 0  . Years of Education: N/A   Occupational History  .     Social History Main Topics  . Smoking status: Former Smoker -- 1.00 packs/day for 20 years    Quit date: 06/13/1977  . Smokeless tobacco: None  . Alcohol Use: Yes  . Drug Use: No  . Sexual Activity: None   Other Topics Concern  . None   Social History Narrative  . None    REPRODUCTIVE HISTORY AND PERSONAL RISK ASSESSMENT FACTORS: Menarche  was at age 20.   postmenopausal Uterus Intact: no, hystectomy in 1980 Ovaries Intact: no, hysterectomy in 1980 [id number], first live birth at age N/A  She has not previously undergone treatment for infertility.   Oral Contraceptive use: 0 years   She has not used HRT in the past.    FAMILY HISTORY:  We obtained a detailed, 4-generation family history.  Significant diagnoses are listed below: Family History  Problem Relation Age of Onset  . Breast cancer Mother 44  . Uterine cancer Mother 20  . Colon cancer Mother 51  . Stroke Father   . Bladder Cancer Father     dx in his 23s - smoker  . Colon cancer Father     dx in his 87s - smoker  . Breast cancer Maternal Aunt 70  . Breast cancer Paternal Aunt   . Stroke Maternal Grandmother   . Colon cancer Maternal Grandfather   . Prostate cancer Maternal Grandfather   . Stroke Paternal Grandmother   . Breast cancer Cousin 67    maternal cousin  . Breast cancer Cousin 67    maternal cousin  . Breast cancer Paternal Aunt   . Colon cancer Paternal Aunt   . Colon cancer Paternal Aunt   .  Colon cancer Paternal Aunt   . Lung cancer Paternal Aunt   . Breast cancer Cousin     paternal cousin  . Breast cancer Cousin     paternal cousin  . Ovarian cancer Cousin     paternal cousin  . Testicular cancer Cousin     paternal cousin  The patient had five brothers and six sisters.  One brother had liver cancer that was attributed to agent orange exposure and died at age 52.  Her father had eight sisters, 6 of which had cancer.  Two had breast cancer, three had colon cancer and one had lung cancer, but was a smoker.   Patient's maternal ancestors are of [place] descent, and paternal ancestors are of [place] and [place] descent. There is no reported Ashkenazi Jewish ancestry. There is no known consanguinity.  GENETIC COUNSELING ASSESSMENT: Melinda Martinez is a 72 y.o. female with a personal history of osteosarcoma and breast cancer and family history of  breast, ovarian, colon, uterine, prostate, liver and testicular cancer which somewhat suggestive of a hereditary cancer syndrome and predisposition to cancer. We, therefore, discussed and recommended the following at today's visit.   DISCUSSION: We reviewed the characteristics, features and inheritance patterns of hereditary cancer syndromes. We also discussed genetic testing, including the appropriate family members to test, the process of testing, insurance coverage and turn-around-time for results. We reviewed hereditary cancer syndromes and discussed that her family's cancers can be related through several different types of syndromes including hereditary breast and ovairan cancer syndrome or even Lynch syndrome.  Because of the different forms of cancer in the family we will pursue the comprehensive cancer panel through [facility]  PLAN: After considering the risks, benefits, and limitations, CHASTY RANDAL provided informed consent to pursue genetic testing and the blood sample will be sent to [facility] for analysis of the Comprehensive Cancer Panel. We discussed the implications of a positive, negative and/ or variant of uncertain significance genetic test result. Results should be available within approximately 3 weeks' time, at which point they will be disclosed by telephone to Melinda Martinez, as will any additional recommendations warranted by these results. Melinda Martinez will receive a summary of her genetic counseling visit and a copy of her results once available. This information will also be available in Epic. We encouraged Melinda Martinez to remain in contact with cancer genetics annually so that we can continuously update the family history and inform her of any changes in cancer genetics and testing that may be of benefit for her family. Melinda Martinez's questions were answered to her satisfaction today. Our contact information was provided should additional questions or concerns  arise.  The patient was seen for a total of 60 minutes, greater than 50% of which was spent face-to-face counseling.  This note will also be sent to the referring provider via the electronic medical record. The patient will be supplied with a summary of this genetic counseling discussion as well as educational information on the discussed hereditary cancer syndromes following the conclusion of their visit.   Patient was discussed with Dr. Drue Second.   _______________________________________________________________________ For Office Staff:  Number of people involved in session: 1 Was an Intern/ student involved with case: yes

## 2013-06-14 ENCOUNTER — Encounter: Payer: Self-pay | Admitting: *Deleted

## 2013-06-14 NOTE — Progress Notes (Signed)
[facility] Clinical Social Work  Clinical Social Work phoned Pt at home to follow up on request for [employer] now that she plans to attend [facility] for her treatment. Pt is still interested in [employer] and CSW completed referral. Pt appreciative and eager to talk with [facility], as she was awaiting their call.   Clinical Social Work interventions: Referral to [employer] completed as requested by Pt.   Doreen Salvage, LCSW Clinical Social Worker [facility] for Patient & Family Support [facility] Wednesday, Thursday and Friday Phone: [phone] Fax: [phone]

## 2013-06-20 ENCOUNTER — Other Ambulatory Visit: Payer: Self-pay | Admitting: *Deleted

## 2013-06-20 ENCOUNTER — Telehealth: Payer: Self-pay | Admitting: *Deleted

## 2013-06-20 ENCOUNTER — Telehealth: Payer: Self-pay | Admitting: Oncology

## 2013-06-20 DIAGNOSIS — C50411 Malignant neoplasm of upper-outer quadrant of right female breast: Secondary | ICD-10-CM

## 2013-06-20 NOTE — Telephone Encounter (Signed)
Pt informed that [facility] clinic has requested she have PET/CT scan performed prior to her having treatment.  Scheduled pt for 06/24/13 at 8:15.  Confirmed date and time.  Pt denies further needs.

## 2013-06-21 ENCOUNTER — Other Ambulatory Visit: Payer: Self-pay | Admitting: *Deleted

## 2013-06-21 ENCOUNTER — Ambulatory Visit (HOSPITAL_COMMUNITY): Payer: Medicare Other

## 2013-06-21 ENCOUNTER — Ambulatory Visit: Payer: Medicare Other | Admitting: Oncology

## 2013-06-24 ENCOUNTER — Ambulatory Visit: Payer: Self-pay

## 2013-06-26 ENCOUNTER — Telehealth: Payer: Self-pay | Admitting: Genetic Counselor

## 2013-06-26 NOTE — Telephone Encounter (Signed)
Revealed negative genetic testing but that she has a PTEN VUS.  She would like me to try to FAX her results up to her doctor at [facility].  She is also interested in the PTEN study at [facility] clinic.

## 2013-06-27 ENCOUNTER — Ambulatory Visit (HOSPITAL_COMMUNITY): Payer: Medicare Other

## 2013-06-27 ENCOUNTER — Other Ambulatory Visit (HOSPITAL_COMMUNITY): Payer: Medicare Other

## 2013-07-15 ENCOUNTER — Telehealth: Payer: Self-pay | Admitting: Oncology

## 2013-07-15 NOTE — Telephone Encounter (Signed)
The breast MRI (05/2013) scan and the [id number] slide will be fedex'ed today to the [facility] clinic

## 2013-09-08 DEATH — deceased

## 2014-03-01 IMAGING — CT CT CHEST W/ CM
2 of 4 series · 15 of 36 positions shown, 18 images · IV contrast (isovue)
Comparison: Breast MRI 06/03/2013 at [HOSPITAL], chest
radiograph most recent exam 06/12/2007. CT abdomen/ pelvis
05/20/2013 at [HOSPITAL] (virtual colonoscopy).

CLINICAL DATA: Recently diagnosed breast cancer, possible rib
lesion on prior exam

EXAM:
CT CHEST WITH CONTRAST
TECHNIQUE: Multidetector CT imaging of the chest was performed during
intravenous contrast administration.
CONTRAST:  75 cc Isovue 370 IV contrast

[Series 2: routine chest with · axial · 0.68mm/px · z∈[-638,-344]mm · 12 of 71 slices shown, 15 images]
[im 6/71  mediastinal]
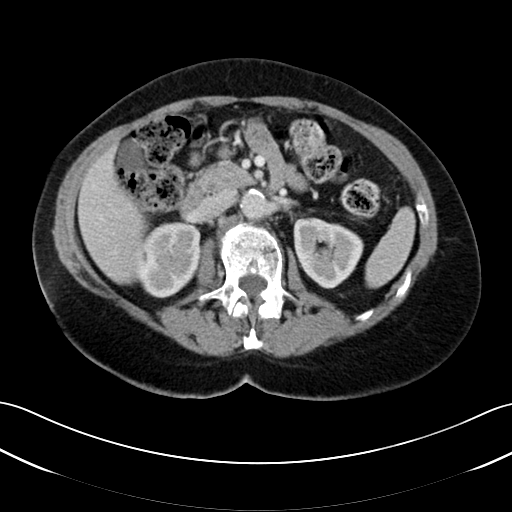
[im 6/71  lung]
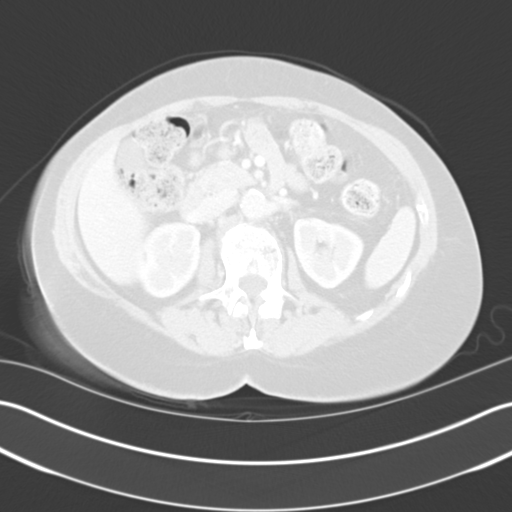
[im 11/71  lung]
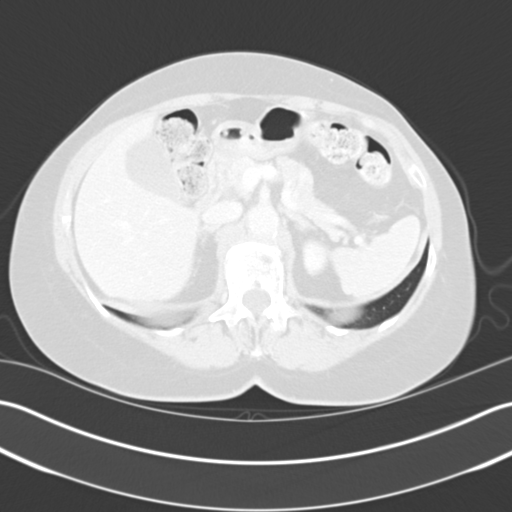
[im 17/71  lung]
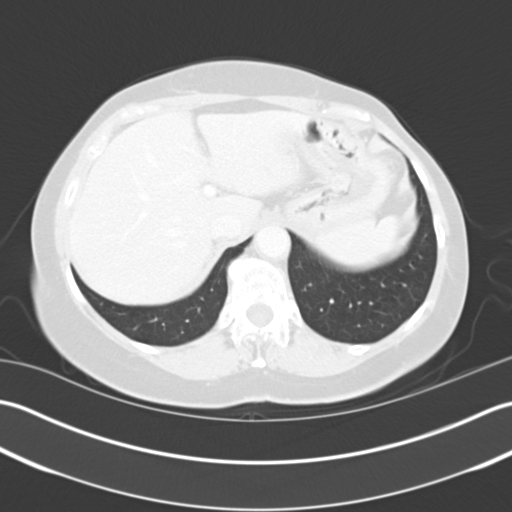
[im 22/71  lung]
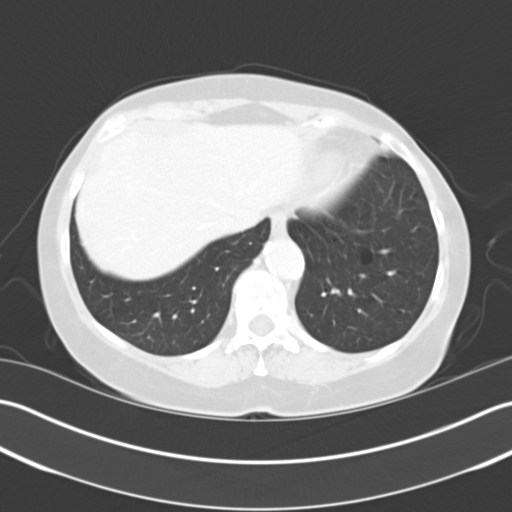
[im 27/71  mediastinal]
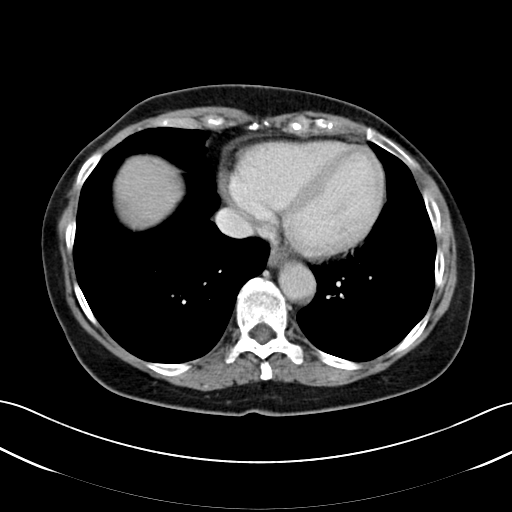
[im 27/71  lung]
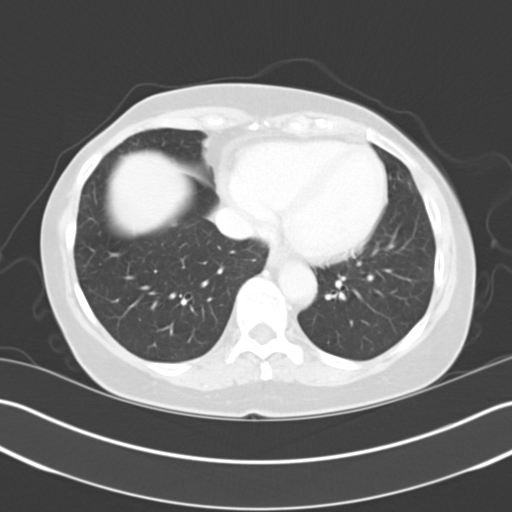
[im 33/71  lung]
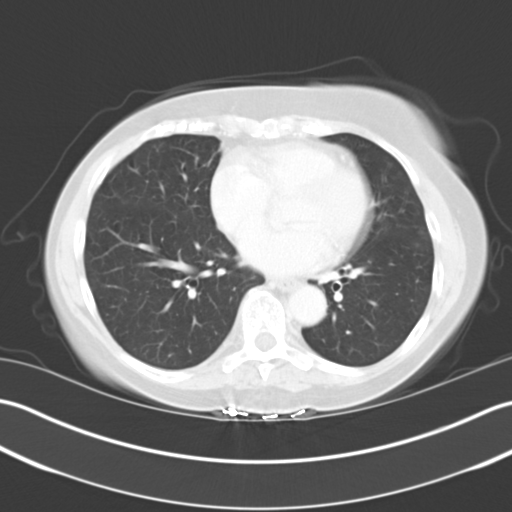
[im 38/71  lung]
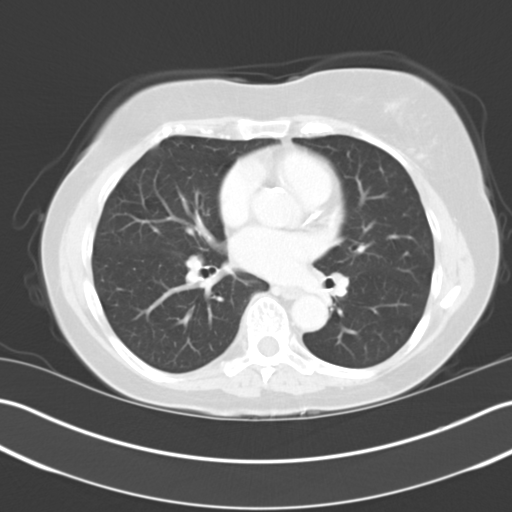
[im 44/71  lung]
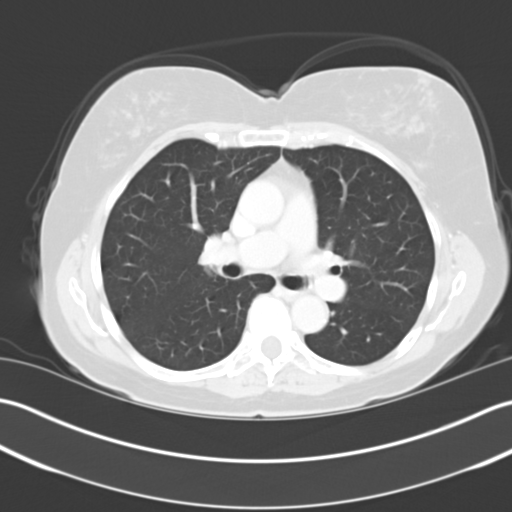
[im 49/71  mediastinal]
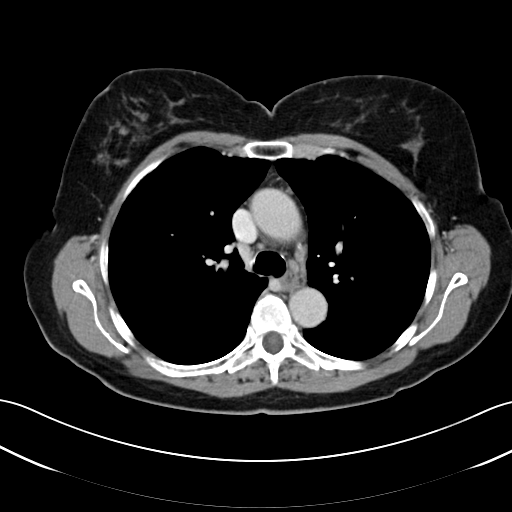
[im 49/71  lung]
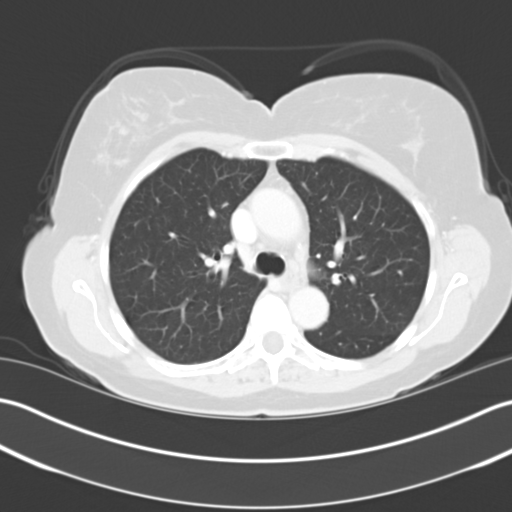
[im 54/71  lung]
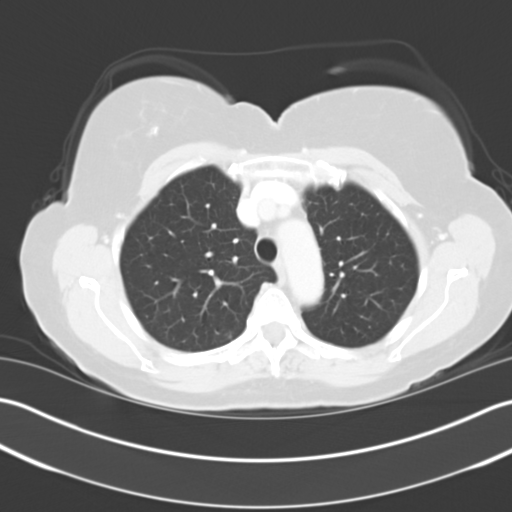
[im 60/71  lung]
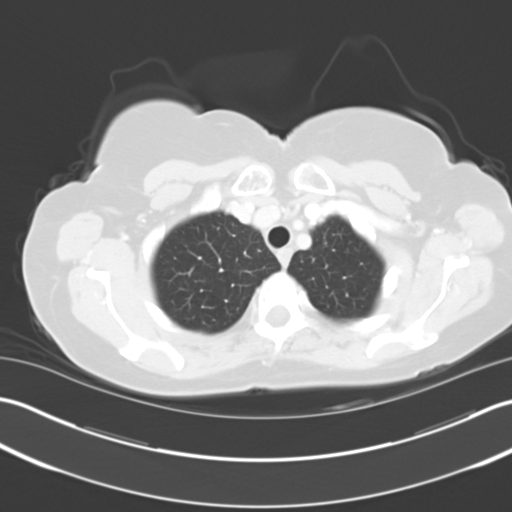
[im 65/71  lung]
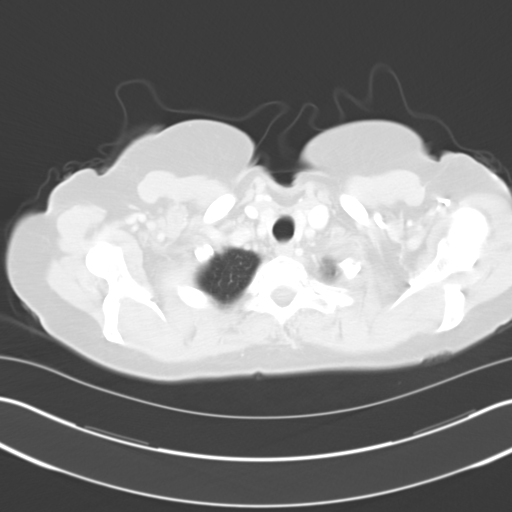

[Series 5: cor routine chest with · coronal · 0.71mm/px · 3 of 113 slices shown]
[im 23/113  lung]
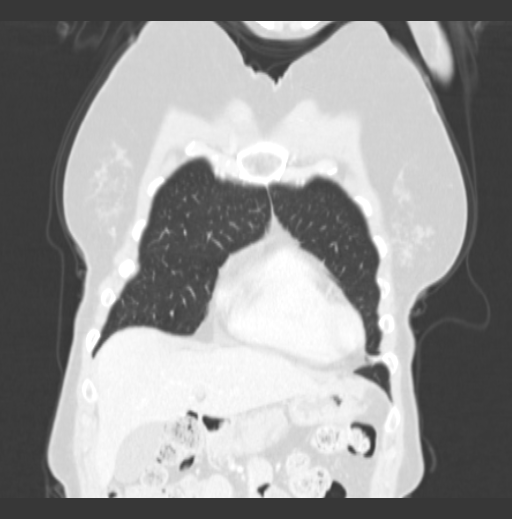
[im 45/113  lung]
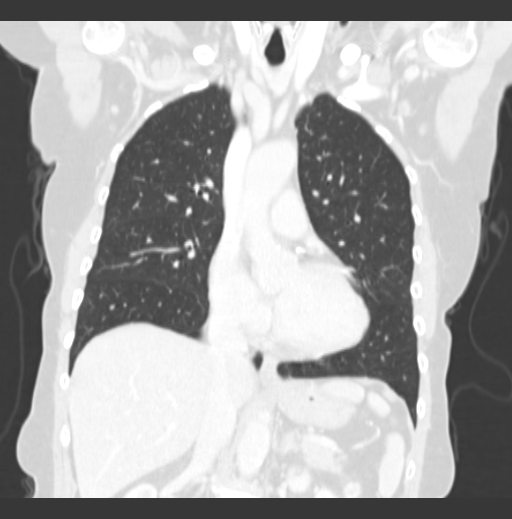
[im 68/113  lung]
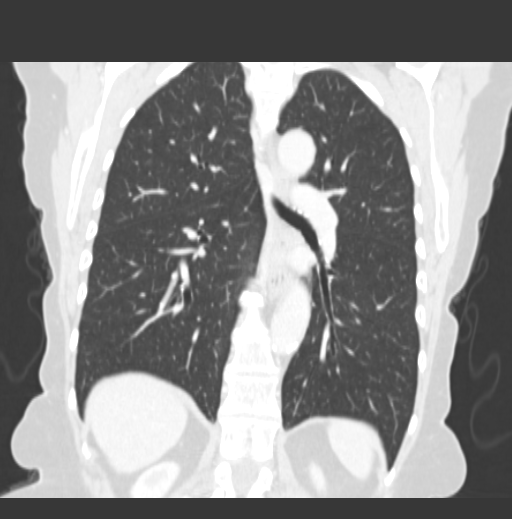

[15 of 36 positions shown; findings below may reference images not displayed]

FINDINGS: A 2 mm right upper lobe pulmonary parenchymal nodule image 18 is
identified. Stable lies incidentally identified at the left lung
base. 3 mm pulmonary parenchymal nodule is identified in the
superior segment left lower lobe image 31.

Metallic clip artifact noted in the right breast 9 o'clock location
at the site of recently proven breast cancer. The areas of signal
abnormality identified on the prior exam correspond to healing rib
fractures involving the right anterior 4th, 5th, 6th ribs. The
previously seen trace adjacent pleural fluid is resolved and no
pleural effusion is identified. No lytic or sclerotic osseous lesion
is identified.

Great vessels are normal in caliber. Trace fluid is identified
within the superior pericardial recess. Heart size is normal.
Moderate atheromatous aortic calcification without aneurysm. Adrenal
glands are unremarkable at incomplete imaging of the upper abdomen.
IMPRESSION: Healing rib fractures of the right anterior 4th, 5th, and 6th ribs
correspond to the signal abnormality seen on the dissimilar prior
exam. No CT evidence for intrathoracic metastatic disease.

Nonspecific 2 mm right upper lobe and 3 mm left lower lobe pulmonary
nodules as above. If the patient is at high risk for bronchogenic
carcinoma, follow-up chest CT at 6year is recommended. If the
patient is at low risk, no follow-up is needed. This recommendation
follows the consensus statement: Guidelines for Management of Small
Pulmonary Nodules Detected on CT Scans: A Statement from the

## 2015-03-13 ENCOUNTER — Encounter: Payer: Self-pay | Admitting: Gastroenterology
